# Patient Record
Sex: Female | Born: 1985 | Race: Black or African American | Hispanic: No | Marital: Single | Smoking: Never smoker
Health system: Southern US, Community
[De-identification: ages and names within clinical notes are randomized; demographics above are authoritative.]

---

## 2015-03-31 ENCOUNTER — Emergency Department
Admission: EM | Admit: 2015-03-31 | Discharge: 2015-03-31 | Disposition: A | Discharge status: Home / Self Care | Payer: Medicaid Other | Attending: Emergency Medicine | Admitting: Emergency Medicine

## 2015-03-31 ENCOUNTER — Encounter: Payer: Self-pay | Admitting: Emergency Medicine

## 2015-03-31 DIAGNOSIS — Z3202 Encounter for pregnancy test, result negative: Secondary | ICD-10-CM | POA: Diagnosis not present

## 2015-03-31 DIAGNOSIS — R1030 Lower abdominal pain, unspecified: Secondary | ICD-10-CM | POA: Diagnosis present

## 2015-03-31 DIAGNOSIS — N12 Tubulo-interstitial nephritis, not specified as acute or chronic: Secondary | ICD-10-CM | POA: Insufficient documentation

## 2015-03-31 LAB — URINALYSIS COMPLETE WITH MICROSCOPIC (ARMC ONLY)
Bilirubin Urine: NEGATIVE
Glucose, UA: NEGATIVE mg/dL
Ketones, ur: NEGATIVE mg/dL
NITRITE: NEGATIVE
PH: 6 (ref 5.0–8.0)
PROTEIN: 100 mg/dL — AB
SPECIFIC GRAVITY, URINE: 1.013 (ref 1.005–1.030)

## 2015-03-31 LAB — CBC WITH DIFFERENTIAL/PLATELET
BASOS PCT: 0 %
Basophils Absolute: 0.1 10*3/uL (ref 0–0.1)
EOS ABS: 0 10*3/uL (ref 0–0.7)
Eosinophils Relative: 0 %
HEMATOCRIT: 34.3 % — AB (ref 35.0–47.0)
Hemoglobin: 11.3 g/dL — ABNORMAL LOW (ref 12.0–16.0)
Lymphocytes Relative: 6 %
Lymphs Abs: 1 10*3/uL (ref 1.0–3.6)
MCH: 26.1 pg (ref 26.0–34.0)
MCHC: 32.9 g/dL (ref 32.0–36.0)
MCV: 79.5 fL — AB (ref 80.0–100.0)
Monocytes Absolute: 0.9 10*3/uL (ref 0.2–0.9)
Monocytes Relative: 6 %
NEUTROS ABS: 13.1 10*3/uL — AB (ref 1.4–6.5)
Neutrophils Relative %: 88 %
Platelets: 401 10*3/uL (ref 150–440)
RBC: 4.31 MIL/uL (ref 3.80–5.20)
RDW: 13.7 % (ref 11.5–14.5)
WBC: 15 10*3/uL — ABNORMAL HIGH (ref 3.6–11.0)

## 2015-03-31 LAB — COMPREHENSIVE METABOLIC PANEL
ALT: 12 U/L — ABNORMAL LOW (ref 14–54)
AST: 12 U/L — ABNORMAL LOW (ref 15–41)
Albumin: 3.8 g/dL (ref 3.5–5.0)
Alkaline Phosphatase: 74 U/L (ref 38–126)
Anion gap: 7 (ref 5–15)
BUN: 7 mg/dL (ref 6–20)
CO2: 24 mmol/L (ref 22–32)
Calcium: 9 mg/dL (ref 8.9–10.3)
Chloride: 106 mmol/L (ref 101–111)
Creatinine, Ser: 0.64 mg/dL (ref 0.44–1.00)
GFR calc Af Amer: >60 mL/min (ref >60)
GFR calc non Af Amer: >60 mL/min (ref >60)
GLUCOSE: 130 mg/dL — AB (ref 65–99)
POTASSIUM: 4.1 mmol/L (ref 3.5–5.1)
Sodium: 137 mmol/L (ref 135–145)
Total Bilirubin: 0.6 mg/dL (ref 0.3–1.2)
Total Protein: 8.1 g/dL (ref 6.5–8.1)

## 2015-03-31 LAB — POCT PREGNANCY, URINE: PREG TEST UR: NEGATIVE

## 2015-03-31 LAB — LIPASE, BLOOD: Lipase: 27 U/L (ref 22–51)

## 2015-03-31 MED ORDER — LEVOFLOXACIN 500 MG PO TABS: 500.0000 mg | ORAL_TABLET | Freq: Every day | ORAL | Status: AC

## 2015-03-31 MED ORDER — SODIUM CHLORIDE 0.9 % IV BOLUS (SEPSIS)
1000.0000 mL | Freq: Once | INTRAVENOUS | Status: AC
  Administered 2015-03-31: 1000 mL via INTRAVENOUS

## 2015-03-31 MED ORDER — MORPHINE SULFATE 4 MG/ML IJ SOLN
4.0000 mg | Freq: Once | INTRAMUSCULAR | Status: AC
  Administered 2015-03-31: 4 mg via INTRAVENOUS

## 2015-03-31 MED ORDER — DEXTROSE 5 % IV SOLN
INTRAVENOUS | Status: AC
  Filled 2015-03-31: qty 10

## 2015-03-31 MED ORDER — ONDANSETRON HCL 4 MG/2ML IJ SOLN
INTRAMUSCULAR | Status: AC
  Filled 2015-03-31: qty 2

## 2015-03-31 MED ORDER — TRAMADOL HCL 50 MG PO TABS: 50.0000 mg | ORAL_TABLET | Freq: Four times a day (QID) | ORAL | Status: AC | PRN

## 2015-03-31 MED ORDER — MORPHINE SULFATE 4 MG/ML IJ SOLN
INTRAMUSCULAR | Status: AC
  Filled 2015-03-31: qty 1

## 2015-03-31 MED ORDER — ONDANSETRON HCL 4 MG/2ML IJ SOLN
4.0000 mg | Freq: Once | INTRAMUSCULAR | Status: AC
  Administered 2015-03-31: 4 mg via INTRAVENOUS

## 2015-03-31 MED ORDER — ONDANSETRON HCL 4 MG PO TABS: 4.0000 mg | ORAL_TABLET | Freq: Every day | ORAL | Status: AC | PRN

## 2015-03-31 MED ORDER — DEXTROSE 5 % IV SOLN
1.0000 g | Freq: Once | INTRAVENOUS | Status: AC
  Administered 2015-03-31: 1 g via INTRAVENOUS

## 2015-03-31 NOTE — Discharge Instructions (Signed)
Pyelonephritis, Adult Pyelonephritis is a kidney infection. A kidney infection can happen quickly, or it can last for a long time. HOME CARE   Take your medicine (antibiotics) as told. Finish it even if you start to feel better.  Keep all doctor visits as told.  Drink enough fluids to keep your pee (urine) clear or pale yellow.  Only take medicine as told by your doctor. GET HELP RIGHT AWAY IF:   You have a fever or lasting symptoms for more than 2-3 days.  You have a fever and your symptoms suddenly get worse.  You cannot take your medicine or drink fluids as told.  You have chills and shaking.  You feel very weak or pass out (faint).  You do not feel better after 2 days. MAKE SURE YOU:  Understand these instructions.  Will watch your condition.  Will get help right away if you are not doing well or get worse. Document Released: 12/11/2004 Document Revised: 05/04/2012 Document Reviewed: 04/23/2011 [facility] Patient Information 2015 [place], [place]. This information is not intended to replace advice given to you by your health care provider. Make sure you discuss any questions you have with your health care provider.    Please take your antibiotics as prescribed. Please take nausea and pain medication as needed, as written. Please follow up with her primary care doctor in 2-3 days for recheck. Return to the emergency department if you're unable to keep her antibiotics down due to vomiting, or you develop a fever or worsening abdominal pain.

## 2015-03-31 NOTE — ED Notes (Signed)
Unsure if febrile, has implenon

## 2015-03-31 NOTE — ED Provider Notes (Signed)
[facility] Emergency Department Provider Note  Time seen: 9:44 AM  I have reviewed the triage vital signs and the nursing notes.   HISTORY  Chief Complaint Abdominal Pain    HPI Cynthia Rice is a 29 y.o. female with no past medical history who presents the emergency department with lower abdominal pain, nausea and vomiting. According to the patient for the past 3 days she has had lower abdominal pain, and for the past 24 hours has been nauseated with vomiting. Patient denies any fevers at home. Patient states a history of urinary tract infections in the past which this feels similar to. Denies any dysuria but does state cloudy urine with a foul smell 1 week. Patient denies any diarrhea. Describes the pain as dull and constant moderate in severity. No noted modifying factors.    History reviewed. No pertinent past medical history.  There are no active problems to display for this patient.   No past surgical history on file.  No current outpatient prescriptions on file.  Allergies Review of patient's allergies indicates no known allergies.  No family history on file.  Social History History  Substance Use Topics  . Smoking status: Not on file  . Smokeless tobacco: Not on file  . Alcohol Use: Not on file    Review of Systems Constitutional: Negative for fever. Cardiovascular: Negative for chest pain. Respiratory: Negative for shortness of breath. Gastrointestinal: Positive for lower abdominal pain, nausea and vomiting. Negative diarrhea. Genitourinary: Negative for dysuria. Positive for foul smell and cloudy urine. Musculoskeletal: Positive for right back pain. Skin: Negative for rash.  10-point ROS otherwise negative.  ____________________________________________   PHYSICAL EXAM:  VITAL SIGNS: ED Triage Vitals  Enc Vitals Group     BP 03/31/15 0716 111/58 mmHg     Pulse Rate 03/31/15 0716 72     Resp 03/31/15 0716 18     Temp  03/31/15 0716 97.9 F (36.6 C)     Temp Source 03/31/15 0716 Oral     SpO2 03/31/15 0716 99 %     Weight 03/31/15 0716 215 lb (97.523 kg)     Height 03/31/15 0716 5\' 6"  (1.676 m)     Head Cir --      Peak Flow --      Pain Score 03/31/15 0719 10     Pain Loc --      Pain Edu? --      Excl. in GC? --     Constitutional: Alert and oriented. Well appearing and in no distress. Eyes: Normal exam ENT   Mouth/Throat: Dry mucous membranes. Cardiovascular: Normal rate, regular rhythm. No murmurs, rubs, or gallops. Respiratory: Normal respiratory effort without tachypnea nor retractions. Breath sounds are clear  Gastrointestinal: Mild suprapubic tenderness to palpation. Moderate right CVA tenderness to palpation. No rebound/guarding. Musculoskeletal: Nontender with normal range of motion in all extremities. Neurologic:  Normal speech and language.  Skin:  Skin is warm, dry and intact.  Psychiatric: Mood and affect are normal. Speech and behavior are normal.   ____________________________________________   INITIAL IMPRESSION / ASSESSMENT AND PLAN / ED COURSE  Pertinent labs & imaging results that were available during my care of the patient were reviewed by me and considered in my medical decision making (see chart for details).  29 year old female with no past medical history presents with lower abdominal pain. Exam shows mild suprapubic tenderness and moderate right CVA tenderness to palpation. Blood cell count elevated at 15, urine consistent with urinary  tract infection. Patient likely with early pyelonephritis. We'll IV hydrate, start IV antibiotics, and treat pain and nausea. Anticipate discharge home with oral antibiotics.  ____________________________________________   FINAL CLINICAL IMPRESSION(S) / ED DIAGNOSES  Pyelonephritis   Minna AntisKevin Marionna Gonia, MD 03/31/15 1038

## 2015-08-15 DIAGNOSIS — G43009 Migraine without aura, not intractable, without status migrainosus: Secondary | ICD-10-CM

## 2015-08-15 HISTORY — DX: Migraine without aura, not intractable, without status migrainosus: G43.009

## 2015-12-19 ENCOUNTER — Encounter: Payer: Self-pay | Admitting: Urgent Care

## 2015-12-19 DIAGNOSIS — O3481 Maternal care for other abnormalities of pelvic organs, first trimester: Secondary | ICD-10-CM | POA: Diagnosis not present

## 2015-12-19 DIAGNOSIS — O210 Mild hyperemesis gravidarum: Secondary | ICD-10-CM | POA: Insufficient documentation

## 2015-12-19 DIAGNOSIS — N83202 Unspecified ovarian cyst, left side: Secondary | ICD-10-CM | POA: Diagnosis not present

## 2015-12-19 DIAGNOSIS — Z3A01 Less than 8 weeks gestation of pregnancy: Secondary | ICD-10-CM | POA: Diagnosis not present

## 2015-12-19 LAB — POCT PREGNANCY, URINE: Preg Test, Ur: POSITIVE — AB

## 2015-12-19 NOTE — ED Notes (Signed)
Patient presents with c/o vomiting today. Denies diarrhea, fever, and urinary symptoms. Patient advises RN of approximately 6 emesis episodes.

## 2015-12-19 NOTE — ED Notes (Signed)
Spoke with Inocencio Homes, MD - made her aware of presenting c/o and triage assessment. Patient with no abd pain, vaginal bleeding, back pain, or urinary c/o. MD made aware that POCT pregnancy test was positive. No further orders at this time - MD only wants pregnancy test.

## 2015-12-20 ENCOUNTER — Emergency Department
Admission: EM | Admit: 2015-12-20 | Discharge: 2015-12-20 | Disposition: A | Discharge status: Home / Self Care | Payer: Medicaid Other | Attending: Emergency Medicine | Admitting: Emergency Medicine

## 2015-12-20 ENCOUNTER — Emergency Department: Payer: Medicaid Other

## 2015-12-20 DIAGNOSIS — N83209 Unspecified ovarian cyst, unspecified side: Secondary | ICD-10-CM

## 2015-12-20 DIAGNOSIS — Z3491 Encounter for supervision of normal pregnancy, unspecified, first trimester: Secondary | ICD-10-CM

## 2015-12-20 DIAGNOSIS — O219 Vomiting of pregnancy, unspecified: Secondary | ICD-10-CM

## 2015-12-20 LAB — COMPREHENSIVE METABOLIC PANEL
ALBUMIN: 4.4 g/dL (ref 3.5–5.0)
ALT: 11 U/L — AB (ref 14–54)
AST: 14 U/L — ABNORMAL LOW (ref 15–41)
Alkaline Phosphatase: 58 U/L (ref 38–126)
Anion gap: 8 (ref 5–15)
BUN: 7 mg/dL (ref 6–20)
CHLORIDE: 105 mmol/L (ref 101–111)
CO2: 22 mmol/L (ref 22–32)
Calcium: 9.2 mg/dL (ref 8.9–10.3)
Creatinine, Ser: 0.56 mg/dL (ref 0.44–1.00)
GFR calc Af Amer: >60 mL/min (ref >60)
GFR calc non Af Amer: >60 mL/min (ref >60)
Glucose, Bld: 85 mg/dL (ref 65–99)
Potassium: 3.9 mmol/L (ref 3.5–5.1)
SODIUM: 135 mmol/L (ref 135–145)
Total Bilirubin: 0.7 mg/dL (ref 0.3–1.2)
Total Protein: 8 g/dL (ref 6.5–8.1)

## 2015-12-20 LAB — CBC
HCT: 38.7 % (ref 35.0–47.0)
Hemoglobin: 12.8 g/dL (ref 12.0–16.0)
MCH: 26.3 pg (ref 26.0–34.0)
MCHC: 33 g/dL (ref 32.0–36.0)
MCV: 79.7 fL — AB (ref 80.0–100.0)
Platelets: 357 10*3/uL (ref 150–440)
RBC: 4.86 MIL/uL (ref 3.80–5.20)
RDW: 14.6 % — ABNORMAL HIGH (ref 11.5–14.5)
WBC: 9.6 10*3/uL (ref 3.6–11.0)

## 2015-12-20 MED ORDER — PROMETHAZINE HCL 12.5 MG PO TABS: 12.5000 mg | ORAL_TABLET | Freq: Four times a day (QID) | ORAL | Status: DC | PRN

## 2015-12-20 NOTE — ED Notes (Signed)
Patient transported to Ultrasound 

## 2015-12-20 NOTE — ED Notes (Addendum)
MD at the bedside  

## 2015-12-20 NOTE — ED Provider Notes (Signed)
[facility] Emergency Department Provider Note  ____________________________________________  Time seen: 2:15 AM  I have reviewed the triage vital signs and the nursing notes.   HISTORY  Chief Complaint Emesis     HPI Cynthia Rice is a 30 y.o. female presents with nausea and vomiting times today accompanied by pelvic discomfort. Patient denies any fever no diarrhea or urinary symptoms. Patient states current discomfort is described as mild. Patient states last menstrual period was December as she was "changing between birth controls.      Past Medical history  On top. Pregnancy 3  There are no active problems to display for this patient.   Past surgical history   none  Current Outpatient Rx  Name  Route  Sig  Dispense  Refill  . ondansetron (ZOFRAN) 4 MG tablet   Oral   Take 1 tablet (4 mg total) by mouth daily as needed for nausea or vomiting.   15 tablet   1   . traMADol (ULTRAM) 50 MG tablet   Oral   Take 1 tablet (50 mg total) by mouth every 6 (six) hours as needed.   20 tablet   0     Allergies Review of patient's allergies indicates no known allergies.  No family history on file.  Social History Social History  Substance Use Topics  . Smoking status: Never Smoker   . Smokeless tobacco: None  . Alcohol Use: No    Review of Systems  Constitutional: Negative for fever. Eyes: Negative for visual changes. ENT: Negative for sore throat. Cardiovascular: Negative for chest pain. Respiratory: Negative for shortness of breath. Gastrointestinal: Negative for abdominal pain. Positive for vomiting  Genitourinary: Negative for dysuria. Musculoskeletal: Negative for back pain. Skin: Negative for rash. Neurological: Negative for headaches, focal weakness or numbness.   10-point ROS otherwise negative.  ____________________________________________   PHYSICAL EXAM:  VITAL SIGNS: ED Triage Vitals  Enc Vitals Group     BP  12/19/15 2146 113/69 mmHg     Pulse Rate 12/19/15 2146 81     Resp 12/19/15 2146 16     Temp 12/19/15 2146 97.6 F (36.4 C)     Temp Source 12/19/15 2146 Oral     SpO2 12/19/15 2146 100 %     Weight 12/19/15 2146 220 lb (99.791 kg)     Height 12/19/15 2146  (1.676 m)     Head Cir --      Peak Flow --      Pain Score 12/19/15 2146 0     Pain Loc --      Pain Edu? --      Excl. in GC? --      Constitutional: Alert and oriented. Well appearing and in no distress. Eyes: Conjunctivae are normal. PERRL. Normal extraocular movements. ENT   Head: Normocephalic and atraumatic.   Nose: No congestion/rhinnorhea.   Mouth/Throat: Mucous membranes are moist.   Neck: No stridor. Hematological/Lymphatic/Immunilogical: No cervical lymphadenopathy. Cardiovascular: Normal rate, regular rhythm. Normal and symmetric distal pulses are present in all extremities. No murmurs, rubs, or gallops. Respiratory: Normal respiratory effort without tachypnea nor retractions. Breath sounds are clear and equal bilaterally. No wheezes/rales/rhonchi. Gastrointestinal: Soft and nontender. No distention. There is no CVA tenderness. Genitourinary: deferred Musculoskeletal: Nontender with normal range of motion in all extremities. No joint effusions.  No lower extremity tenderness nor edema. Neurologic:  Normal speech and language. No gross focal neurologic deficits are appreciated. Speech is normal.  Skin:  Skin is  warm, dry and intact. No rash noted. Psychiatric: Mood and affect are normal. Speech and behavior are normal. Patient exhibits appropriate insight and judgment.  ____________________________________________    LABS (pertinent positives/negatives)  Labs Reviewed  CBC - Abnormal; Notable for the following:    MCV 79.7 (*)    RDW 14.6 (*)    All other components within normal limits  COMPREHENSIVE METABOLIC PANEL - Abnormal; Notable for the following:    AST 14 (*)    ALT 11 (*)     All other components within normal limits  POCT PREGNANCY, URINE - Abnormal; Notable for the following:    Preg Test, Ur POSITIVE (*)    All other components within normal limits       RADIOLOGY US OB Comp Less 14 Wks (Final result) Result time: 12/20/15 03:55:38   Final result by Rad Results In Interface (12/20/15 03:55:38)   Narrative:   CLINICAL DATA: Pelvic pain in first trimester pregnancy.  EXAM: OBSTETRIC <14 WK [place] AND TRANSVAGINAL OB US  TECHNIQUE: Both transabdominal and transvaginal ultrasound examinations were performed for complete evaluation of the gestation as well as the maternal uterus, adnexal regions, and pelvic cul-de-sac. Transvaginal technique was performed to assess early pregnancy.  COMPARISON: None.  FINDINGS: Intrauterine gestational sac: Visualized/normal in shape.  Yolk sac: Present  Embryo: Present  Cardiac Activity: Present  Heart Rate: 137 bpm  CRL: 8.8 mm  6 w  6 d         [place] EDC: 08/08/2016  Subchorionic hemorrhage: None visualized.  Maternal uterus/adnexae: 3 cm cyst in the left ovary with minimal mural nodularity that is nonvascular.  IMPRESSION: 1. Single living intrauterine pregnancy measuring 6 weeks 6 days. 2. 3 cm cyst left ovary. Minimal mural nodularity may reflect internal hemorrhage/debris but surveillance is recommended to ensure normalization.   Electronically Signed By: Marnee Spring M.D. On: 12/20/2015 03:55          US OB Transvaginal (Final result) Result time: 12/20/15 03:55:38   Procedure changed from US OB Comp Less 14 Wks      Final result by Rad Results In Interface (12/20/15 03:55:38)   Narrative:   CLINICAL DATA: Pelvic pain in first trimester pregnancy.  EXAM: OBSTETRIC <14 WK [place] AND TRANSVAGINAL OB US  TECHNIQUE: Both transabdominal and transvaginal ultrasound examinations were performed for complete evaluation of the gestation as well as the maternal  uterus, adnexal regions, and pelvic cul-de-sac. Transvaginal technique was performed to assess early pregnancy.  COMPARISON: None.  FINDINGS: Intrauterine gestational sac: Visualized/normal in shape.  Yolk sac: Present  Embryo: Present  Cardiac Activity: Present  Heart Rate: 137 bpm  CRL: 8.8 mm  6 w  6 d         [place] EDC: 08/08/2016  Subchorionic hemorrhage: None visualized.  Maternal uterus/adnexae: 3 cm cyst in the left ovary with minimal mural nodularity that is nonvascular.  IMPRESSION: 1. Single living intrauterine pregnancy measuring 6 weeks 6 days. 2. 3 cm cyst left ovary. Minimal mural nodularity may reflect internal hemorrhage/debris but surveillance is recommended to ensure normalization.   Electronically Signed By: Marnee Spring M.D. On: 12/20/2015 03:55       INITIAL IMPRESSION / ASSESSMENT AND PLAN / ED COURSE  Pertinent labs & imaging results that were available during my care of the patient were reviewed by me and considered in my medical decision making (see chart for details).   ____________________________________________   FINAL CLINICAL IMPRESSION(S) / ED DIAGNOSES  Final diagnoses:  First  trimester pregnancy  Cyst of ovary, unspecified laterality      Darci Current, MD 12/20/15 [phone]

## 2015-12-20 NOTE — Discharge Instructions (Signed)
First Trimester of Pregnancy The first trimester of pregnancy is from week 1 until the end of week 12 (months 1 through 3). A week after a sperm fertilizes an egg, the egg will implant on the wall of the uterus. This embryo will begin to develop into a baby. Genes from you and your partner are forming the baby. The female genes determine whether the baby is a boy or a girl. At 6-8 weeks, the eyes and face are formed, and the heartbeat can be seen on ultrasound. At the end of 12 weeks, all the baby's organs are formed.  Now that you are pregnant, you will want to do everything you can to have a healthy baby. Two of the most important things are to get good prenatal care and to follow your health care provider's instructions. Prenatal care is all the medical care you receive before the baby's birth. This care will help prevent, find, and treat any problems during the pregnancy and childbirth. BODY CHANGES Your body goes through many changes during pregnancy. The changes vary from woman to woman.   You may gain or lose a couple of pounds at first.  You may feel sick to your stomach (nauseous) and throw up (vomit). If the vomiting is uncontrollable, call your health care provider.  You may tire easily.  You may develop headaches that can be relieved by medicines approved by your health care provider.  You may urinate more often. Painful urination may mean you have a bladder infection.  You may develop heartburn as a result of your pregnancy.  You may develop constipation because certain hormones are causing the muscles that push waste through your intestines to slow down.  You may develop hemorrhoids or swollen, bulging veins (varicose veins).  Your breasts may begin to grow larger and become tender. Your nipples may stick out more, and the tissue that surrounds them (areola) may become darker.  Your gums may bleed and may be sensitive to brushing and flossing.  Dark spots or blotches (chloasma,  mask of pregnancy) may develop on your face. This will likely fade after the baby is born.  Your menstrual periods will stop.  You may have a loss of appetite.  You may develop cravings for certain kinds of food.  You may have changes in your emotions from day to day, such as being excited to be pregnant or being concerned that something may go wrong with the pregnancy and baby.  You may have more vivid and strange dreams.  You may have changes in your hair. These can include thickening of your hair, rapid growth, and changes in texture. Some women also have hair loss during or after pregnancy, or hair that feels dry or thin. Your hair will most likely return to normal after your baby is born. WHAT TO EXPECT AT YOUR PRENATAL VISITS During a routine prenatal visit:  You will be weighed to make sure you and the baby are growing normally.  Your blood pressure will be taken.  Your abdomen will be measured to track your baby's growth.  The fetal heartbeat will be listened to starting around week 10 or 12 of your pregnancy.  Test results from any previous visits will be discussed. Your health care provider may ask you:  How you are feeling.  If you are feeling the baby move.  If you have had any abnormal symptoms, such as leaking fluid, bleeding, severe headaches, or abdominal cramping.  If you are using any tobacco products,   including cigarettes, chewing tobacco, and electronic cigarettes.  If you have any questions. Other tests that may be performed during your first trimester include:  Blood tests to find your blood type and to check for the presence of any previous infections. They will also be used to check for low iron levels (anemia) and Rh antibodies. Later in the pregnancy, blood tests for diabetes will be done along with other tests if problems develop.  Urine tests to check for infections, diabetes, or protein in the urine.  An ultrasound to confirm the proper growth  and development of the baby.  An amniocentesis to check for possible genetic problems.  Fetal screens for spina bifida and Down syndrome.  You may need other tests to make sure you and the baby are doing well.  HIV (human immunodeficiency virus) testing. Routine prenatal testing includes screening for HIV, unless you choose not to have this test. HOME CARE INSTRUCTIONS  Medicines  Follow your health care provider's instructions regarding medicine use. Specific medicines may be either safe or unsafe to take during pregnancy.  Take your prenatal vitamins as directed.  If you develop constipation, try taking a stool softener if your health care provider approves. Diet  Eat regular, well-balanced meals. Choose a variety of foods, such as meat or vegetable-based protein, fish, milk and low-fat dairy products, vegetables, fruits, and whole grain breads and cereals. Your health care provider will help you determine the amount of weight gain that is right for you.  Avoid raw meat and uncooked cheese. These carry germs that can cause birth defects in the baby.  Eating four or five small meals rather than three large meals a day may help relieve nausea and vomiting. If you start to feel nauseous, eating a few soda crackers can be helpful. Drinking liquids between meals instead of during meals also seems to help nausea and vomiting.  If you develop constipation, eat more high-fiber foods, such as fresh vegetables or fruit and whole grains. Drink enough fluids to keep your urine clear or pale yellow. Activity and Exercise  Exercise only as directed by your health care provider. Exercising will help you:  Control your weight.  Stay in shape.  Be prepared for labor and delivery.  Experiencing pain or cramping in the lower abdomen or low back is a good sign that you should stop exercising. Check with your health care provider before continuing normal exercises.  Try to avoid standing for long  periods of time. Move your legs often if you must stand in one place for a long time.  Avoid heavy lifting.  Wear low-heeled shoes, and practice good posture.  You may continue to have sex unless your health care provider directs you otherwise. Relief of Pain or Discomfort  Wear a good support bra for breast tenderness.   Take warm sitz baths to soothe any pain or discomfort caused by hemorrhoids. Use hemorrhoid cream if your health care provider approves.   Rest with your legs elevated if you have leg cramps or low back pain.  If you develop varicose veins in your legs, wear support hose. Elevate your feet for 15 minutes, 3-4 times a day. Limit salt in your diet. Prenatal Care  Schedule your prenatal visits by the twelfth week of pregnancy. They are usually scheduled monthly at first, then more often in the last 2 months before delivery.  Write down your questions. Take them to your prenatal visits.  Keep all your prenatal visits as directed by your   health care provider. Safety  Wear your seat belt at all times when driving.  Make a list of emergency phone numbers, including numbers for family, friends, the hospital, and police and fire departments. General Tips  Ask your health care provider for a referral to a local prenatal education class. Begin classes no later than at the beginning of month 6 of your pregnancy.  Ask for help if you have counseling or nutritional needs during pregnancy. Your health care provider can offer advice or refer you to specialists for help with various needs.  Do not use hot tubs, steam rooms, or saunas.  Do not douche or use tampons or scented sanitary pads.  Do not cross your legs for long periods of time.  Avoid cat litter boxes and soil used by cats. These carry germs that can cause birth defects in the baby and possibly loss of the fetus by miscarriage or stillbirth.  Avoid all smoking, herbs, alcohol, and medicines not prescribed by  your health care provider. Chemicals in these affect the formation and growth of the baby.  Do not use any tobacco products, including cigarettes, chewing tobacco, and electronic cigarettes. If you need help quitting, ask your health care provider. You may receive counseling support and other resources to help you quit.  Schedule a dentist appointment. At home, brush your teeth with a soft toothbrush and be gentle when you floss. SEEK MEDICAL CARE IF:   You have dizziness.  You have mild pelvic cramps, pelvic pressure, or nagging pain in the abdominal area.  You have persistent nausea, vomiting, or diarrhea.  You have a bad smelling vaginal discharge.  You have pain with urination.  You notice increased swelling in your face, hands, legs, or ankles. SEEK IMMEDIATE MEDICAL CARE IF:   You have a fever.  You are leaking fluid from your vagina.  You have spotting or bleeding from your vagina.  You have severe abdominal cramping or pain.  You have rapid weight gain or loss.  You vomit blood or material that looks like coffee grounds.  You are exposed to German measles and have never had them.  You are exposed to fifth disease or chickenpox.  You develop a severe headache.  You have shortness of breath.  You have any kind of trauma, such as from a fall or a car accident.   This information is not intended to replace advice given to you by your health care provider. Make sure you discuss any questions you have with your health care provider.   Document Released: 10/28/2001 Document Revised: 11/24/2014 Document Reviewed: 09/13/2013 Elsevier Interactive Patient Education 2016 [employer]  

## 2016-10-12 IMAGING — US US OB TRANSVAGINAL
1 series · 14 of 28 positions shown · non-contrast
Comparison: None.

CLINICAL DATA: Pelvic pain in first trimester pregnancy.

EXAM:
OBSTETRIC <14 WK US AND TRANSVAGINAL OB US
TECHNIQUE: Both transabdominal and transvaginal ultrasound examinations were
performed for complete evaluation of the gestation as well as the
maternal uterus, adnexal regions, and pelvic cul-de-sac.
Transvaginal technique was performed to assess early pregnancy.

[Series 1: us ob transvaginal · 0.28mm/px · 60 acquisitions, 14 frames shown]
[im 3/60]
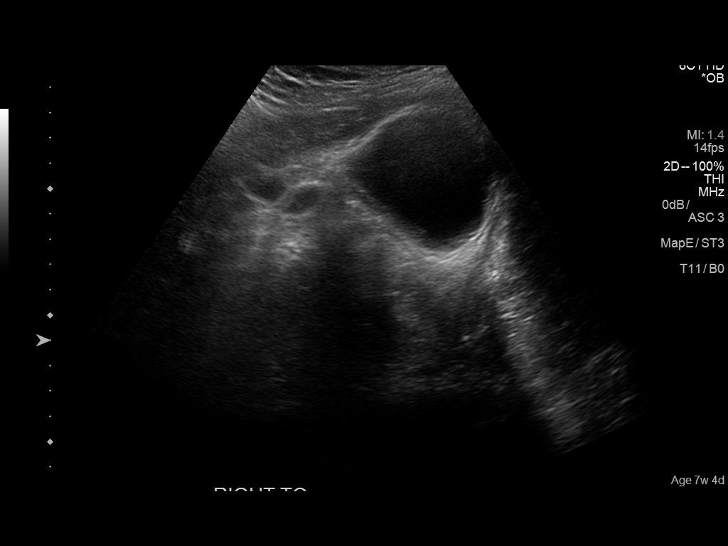
[im 7/60]
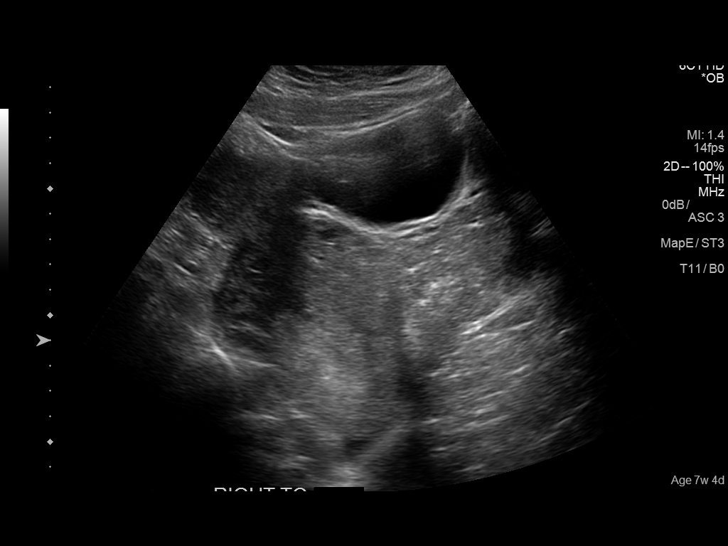
[im 11/60]
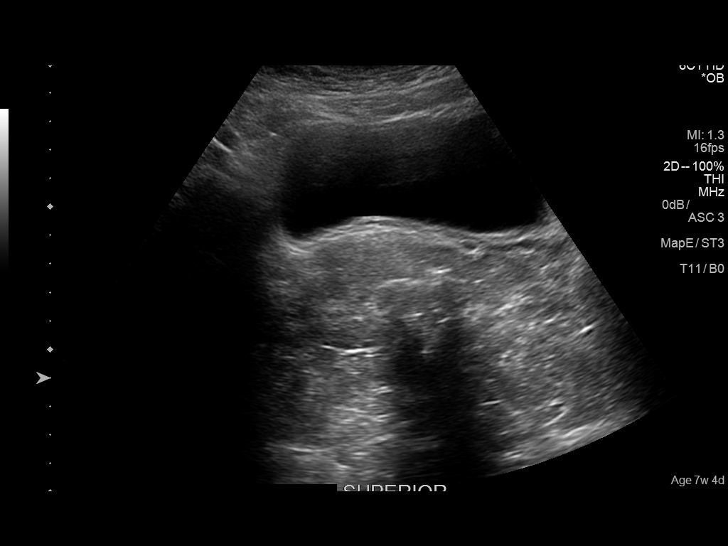
[im 16/60]
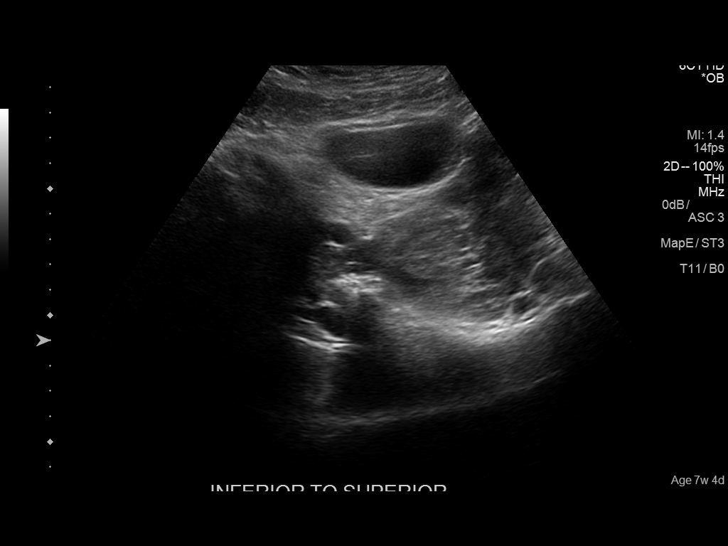
[im 20/60]
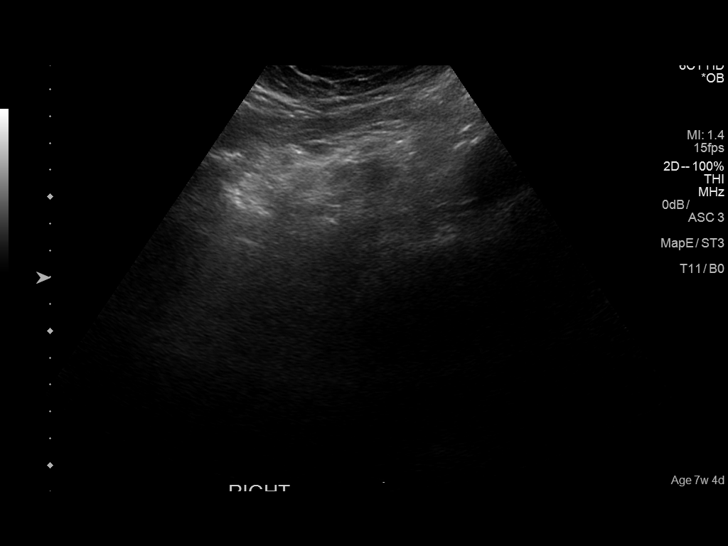
[im 25/60]
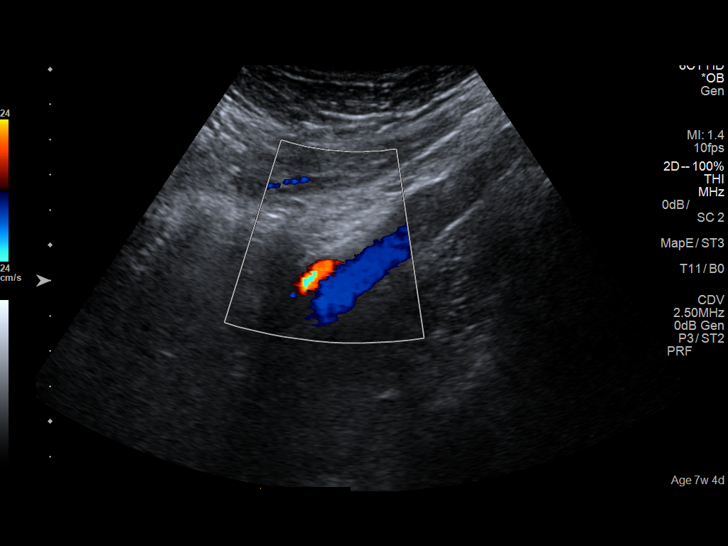
[im 29/60]
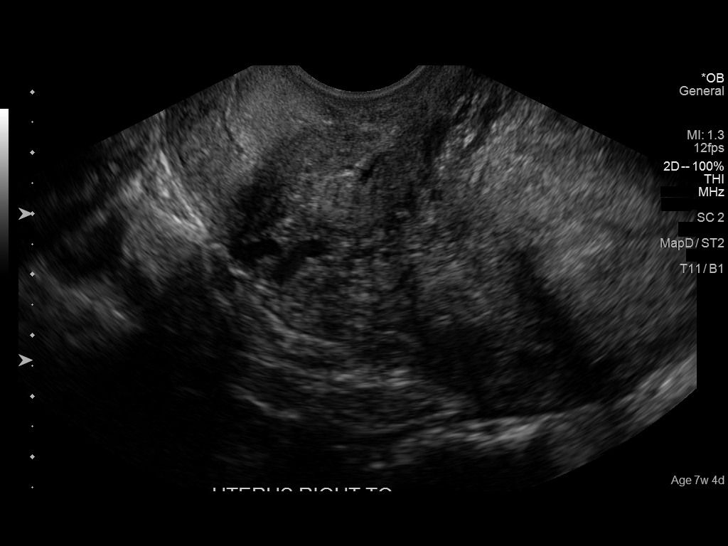
[im 33/60]
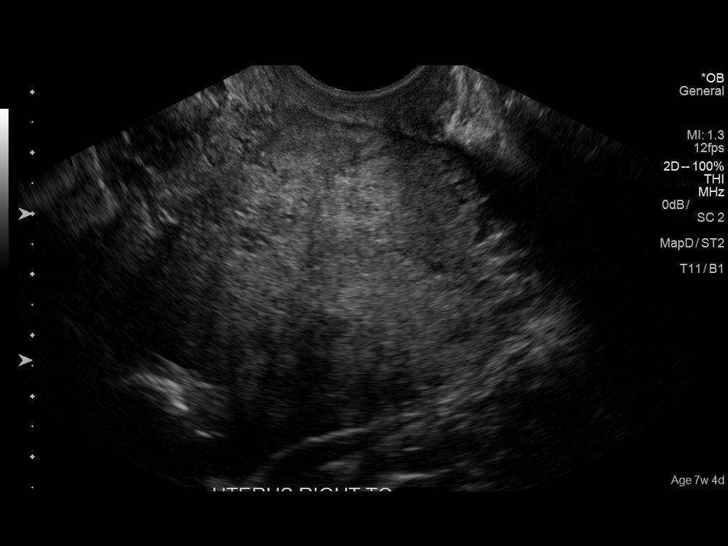
[im 38/60]
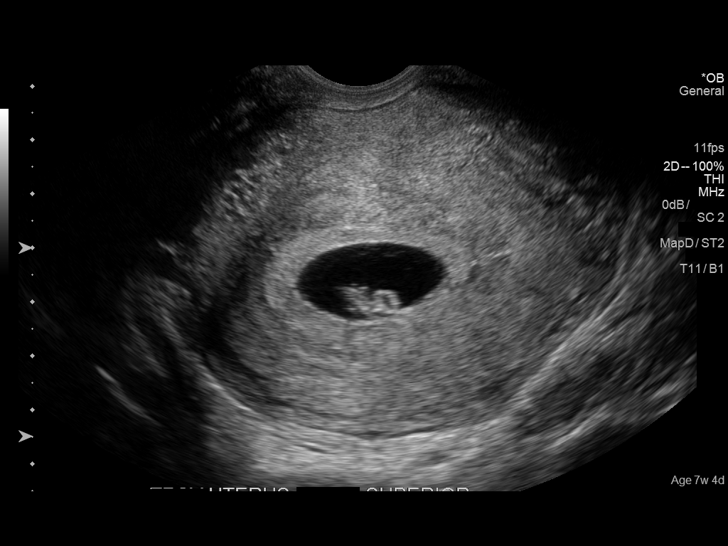
[im 42/60]
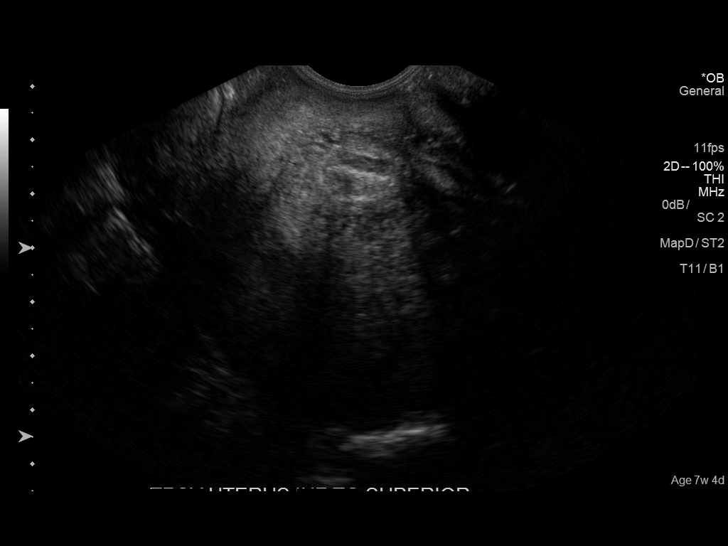
[im 46/60]
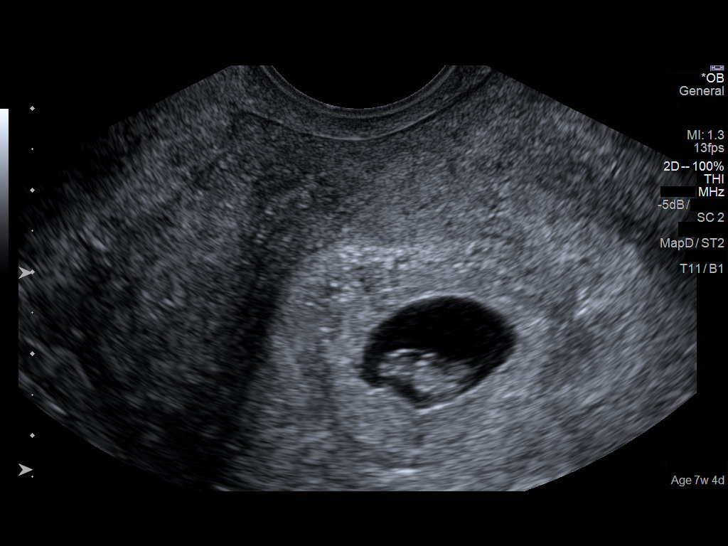
[im 51/60]
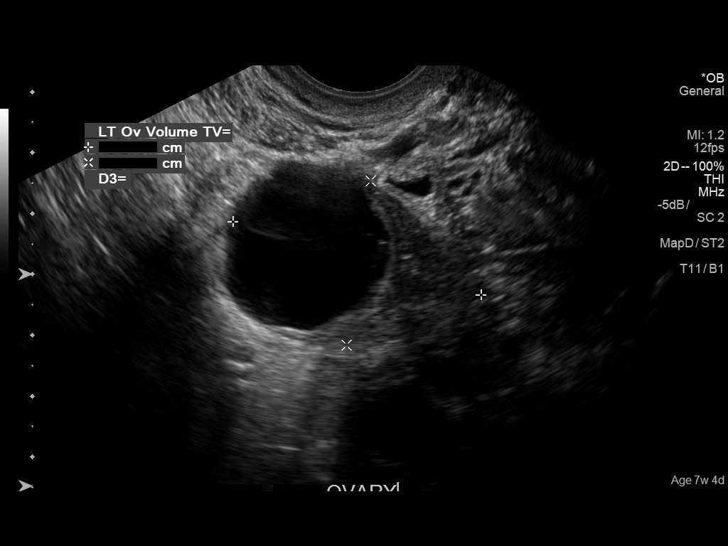
[im 55/60]
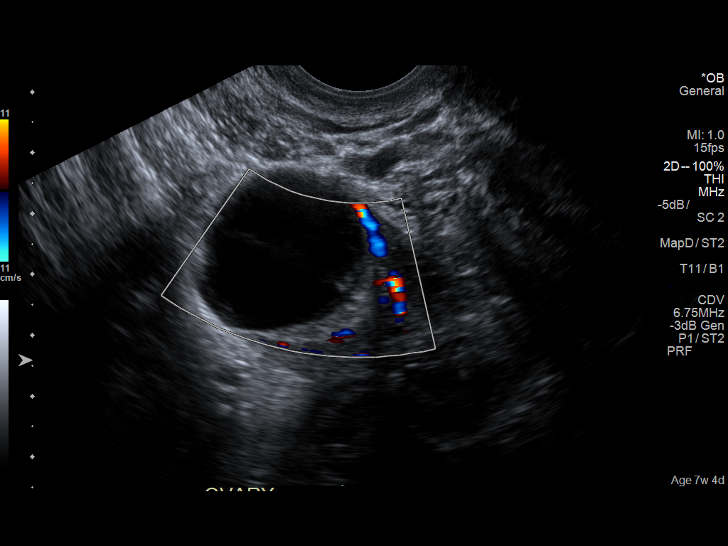
[im 60/60]
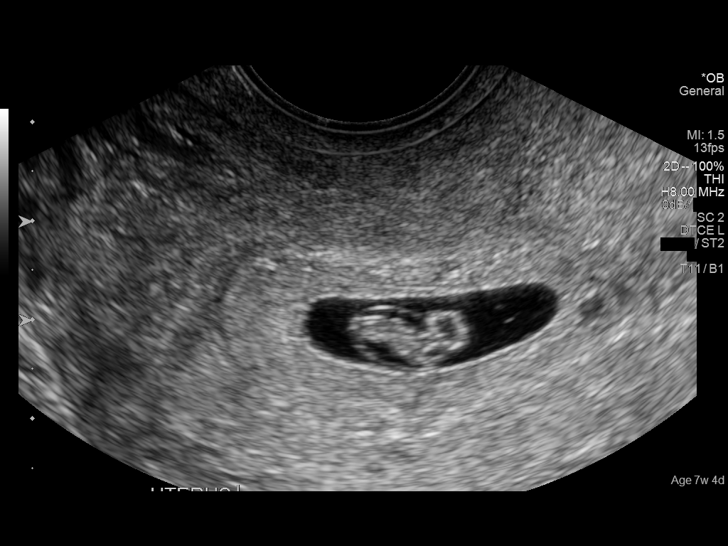

[14 of 28 positions shown; findings below may reference images not displayed]

FINDINGS: Intrauterine gestational sac: Visualized/normal in shape.

Yolk sac:  Present

Embryo:  Present

Cardiac Activity: Present

Heart Rate: 137  bpm

CRL:  8.8 mm   6 w   6 d                  US EDC: 08/08/2016

Subchorionic hemorrhage:  None visualized.

Maternal uterus/adnexae: 3 cm cyst in the left ovary with minimal
mural nodularity that is nonvascular.
IMPRESSION: 1. Single living intrauterine pregnancy measuring 6 weeks 6 days.
2. 3 cm cyst left ovary. Minimal mural nodularity may reflect
internal hemorrhage/debris but surveillance is recommended to ensure
normalization.

## 2017-06-18 DIAGNOSIS — O99340 Other mental disorders complicating pregnancy, unspecified trimester: Secondary | ICD-10-CM | POA: Insufficient documentation

## 2017-06-18 HISTORY — DX: Other mental disorders complicating pregnancy, unspecified trimester: O99.340

## 2017-08-17 DIAGNOSIS — O99013 Anemia complicating pregnancy, third trimester: Secondary | ICD-10-CM

## 2017-08-17 HISTORY — DX: Anemia complicating pregnancy, third trimester: O99.013

## 2017-09-12 DIAGNOSIS — D573 Sickle-cell trait: Secondary | ICD-10-CM | POA: Insufficient documentation

## 2017-09-12 HISTORY — DX: Sickle-cell trait: D57.3

## 2024-02-26 ENCOUNTER — Ambulatory Visit: Admitting: Pediatrics

## 2024-02-26 ENCOUNTER — Encounter: Payer: Self-pay | Admitting: Pediatrics

## 2024-02-26 VITALS — BP 119/85 | HR 72 | Temp 97.8°F | Ht 66.0 in | Wt 234.6 lb

## 2024-02-26 DIAGNOSIS — Z13220 Encounter for screening for lipoid disorders: Secondary | ICD-10-CM | POA: Diagnosis not present

## 2024-02-26 DIAGNOSIS — Z131 Encounter for screening for diabetes mellitus: Secondary | ICD-10-CM

## 2024-02-26 DIAGNOSIS — Z862 Personal history of diseases of the blood and blood-forming organs and certain disorders involving the immune mechanism: Secondary | ICD-10-CM

## 2024-02-26 DIAGNOSIS — Z7689 Persons encountering health services in other specified circumstances: Secondary | ICD-10-CM | POA: Diagnosis not present

## 2024-02-26 DIAGNOSIS — Z6837 Body mass index (BMI) 37.0-37.9, adult: Secondary | ICD-10-CM

## 2024-02-26 DIAGNOSIS — Z3009 Encounter for other general counseling and advice on contraception: Secondary | ICD-10-CM | POA: Diagnosis not present

## 2024-02-26 DIAGNOSIS — Z1330 Encounter for screening examination for mental health and behavioral disorders, unspecified: Secondary | ICD-10-CM | POA: Diagnosis not present

## 2024-02-26 DIAGNOSIS — R6889 Other general symptoms and signs: Secondary | ICD-10-CM

## 2024-02-26 NOTE — Patient Instructions (Addendum)
 The website we discussed today: [email or web address] - to help find a therapist, be sure to filter through zip code, insurance, and you can also check off any area they work in. You should be able to contact them directly from the website through phone or email. Let me know if having trouble finding someone and will help where I can!  Good to meet you! Welcome to [facility]!  As your primary care doctor, I look forward to working with you to help you reach your health goals.  Please be aware of a couple of logistical items: - If you message me on mychart, it may take me 1-2 business days to get back to you. This is for non-urgent messaging.  - If you require urgent clinical attention, please call the clinic or present to urgent care/emergency room - If you have labs, I typically will send a message about them in 1-2 business days. - I am not here on Mondays, otherwise will be available from Tuesday-Friday during 8a-5pm.

## 2024-02-26 NOTE — Progress Notes (Signed)
 Establish Care Note  BP 119/85   Pulse 72   Temp 97.8 F (36.6 C) (Oral)   Ht 5\' 6"  (1.676 m)   Wt 234 lb 9.6 oz (106.4 kg)   LMP 02/05/2024 (Approximate)   SpO2 97%   BMI 37.87 kg/m    Subjective:    Patient ID: Cynthia Rice, female    DOB: [birth year], 38 y.o.   MRN: [id number]  HPI: Cynthia Rice is a 38 y.o. female  Chief Complaint  Patient presents with   Establish Care    Establishing care, the following was discussed today:  Discussed the use of AI scribe software for clinical note transcription with the patient, who gave verbal consent to proceed.  History of Present Illness   Cynthia Rice is a 38 year old female who presents for a routine check-up and discussion of birth control options.  She has not seen a doctor in approximately six years and is here for a routine check-up and to discuss birth control options. She currently uses a Nexplanon implant, placed in 2021, and is considering other contraceptive methods. Her periods now last about seven days, and she experiences periodic dyspareunia.  She has a history of using Nexplanon, with her first experience resulting in a year-long period followed by amenorrhea, which she preferred. Currently, her periods last about seven days. She experiences periodic dyspareunia, which she associates with her current contraceptive method.  She has a family history of diabetes and has experienced symptoms suggestive of iron deficiency, such as pica (ice craving) and feeling extremely cold, especially during winter. She has had extremely low iron levels in the past and does not take iron pills due to constipation.  She has experienced significant life stressors, including the illness and passing of her mother in 2018 and relationship challenges. She reports a history of insomnia during her fourth pregnancy, for which she was prescribed Ambien. She acknowledges having days where she feels down but generally manages to 'pick herself up'. She  has not engaged in therapy but expresses interest in exploring this option.     Current Outpatient Medications on File Prior to Visit  Medication Sig Dispense Refill   promethazine (PHENERGAN) 12.5 MG tablet Take 1 tablet (12.5 mg total) by mouth every 6 (six) hours as needed for nausea or vomiting. (Patient not taking: Reported on 02/26/2024) 30 tablet 0   No current facility-administered medications on file prior to visit.    #HM Will review HM records and updated as needed.  Relevant past medical, surgical, family and social history reviewed and updated as indicated. Interim medical history since our last visit reviewed. Allergies and medications reviewed and updated.  ROS per HPI unless specifically indicated above     Objective:    BP 119/85   Pulse 72   Temp 97.8 F (36.6 C) (Oral)   Ht 5\' 6"  (1.676 m)   Wt 234 lb 9.6 oz (106.4 kg)   LMP 02/05/2024 (Approximate)   SpO2 97%   BMI 37.87 kg/m   Wt Readings from Last 3 Encounters:  02/26/24 234 lb 9.6 oz (106.4 kg)  12/19/15 220 lb (99.8 kg)  03/31/15 215 lb (97.5 kg)     Physical Exam Constitutional:      Appearance: Normal appearance.  HENT:     Head: Normocephalic and atraumatic.  Eyes:     Pupils: Pupils are equal, round, and reactive to light.  Cardiovascular:     Rate and Rhythm: Normal rate and regular rhythm.  Pulses: Normal pulses.     Heart sounds: Normal heart sounds.  Pulmonary:     Effort: Pulmonary effort is normal.     Breath sounds: Normal breath sounds.  Abdominal:     General: Abdomen is flat.     Palpations: Abdomen is soft.  Musculoskeletal:        General: Normal range of motion.     Cervical back: Normal range of motion.  Skin:    General: Skin is warm and dry.     Capillary Refill: Capillary refill takes less than 2 seconds.  Neurological:     General: No focal deficit present.     Mental Status: She is alert. Mental status is at baseline.  Psychiatric:        Mood and Affect:  Mood normal.        Behavior: Behavior normal.         02/26/2024    1:52 PM  Depression screen PHQ 2/9  Decreased Interest 0  Down, Depressed, Hopeless 1  PHQ - 2 Score 1  Altered sleeping 0  Tired, decreased energy 2  Change in appetite 0  Feeling bad or failure about yourself  0  Trouble concentrating 0  Moving slowly or fidgety/restless 0  Suicidal thoughts 0  PHQ-9 Score 3  Difficult doing work/chores Not difficult at all        02/26/2024    1:52 PM  GAD 7 : Generalized Anxiety Score  Nervous, Anxious, on Edge 0  Control/stop worrying 0  Worry too much - different things 0  Trouble relaxing 0  Restless 0  Easily annoyed or irritable 1  Afraid - awful might happen 0  Total GAD 7 Score 1  Anxiety Difficulty Not difficult at all       Assessment & Plan:  Assessment & Plan   History of iron deficiency anemia Cold intolerance Symptoms suggestive of iron deficiency anemia with intolerance to oral iron. Discussed iron infusions as an alternative. - Order iron studies. - Consider iron infusions if levels are low. -     CBC with Differential/Platelet -     Iron, TIBC and Ferritin Panel -     Thyroid Panel With TSH  BMI 37.0-37.9, adult -     Comprehensive metabolic panel with GFR  Encounter for counseling regarding contraception Considering switching from Nexplanon due to prolonged periods and dyspareunia. Discussed IUD and tubal ligation as alternatives, including procedural details and effectiveness. - Check Nexplanon card for insertion date. - Discuss IUD and tubal ligation options. - Refer to gynecology for tubal ligation if desired.  Encounter to establish care Reviewed available patient record including history, medications, problem list. HM updated as able. Will review and/or request outside records (if applicable) and will fill remaining HM gaps as needed at follow up visit.  Encounter for behavioral health screening As part of their intake  evaluation, the patient was screened for depression, anxiety.  PHQ9 SCORE 3, GAD7 SCORE 1. Screening results negative for tested conditions. See plan under problem/diagnosis above. Experiences depression and anxiety. Discussed therapy and potential medication management. - Provide information on Psychology Today website. - Encourage contacting therapists. - Discuss potential for medication management if therapy is insufficient.   Diabetes mellitus screening -     Hemoglobin A1c  Lipid screening -     Lipid panel   Follow up plan: Return in about 2 weeks (around 03/11/2024) for Physical w pap.  Hadassah Letters, MD

## 2024-02-27 LAB — LIPID PANEL
Chol/HDL Ratio: 3.8 ratio (ref 0.0–4.4)
Cholesterol, Total: 195 mg/dL (ref 100–199)
HDL: 51 mg/dL (ref >39)
LDL Chol Calc (NIH): 127 mg/dL — ABNORMAL HIGH (ref 0–99)
Triglycerides: 96 mg/dL (ref 0–149)
VLDL Cholesterol Cal: 17 mg/dL (ref 5–40)

## 2024-02-27 LAB — IRON,TIBC AND FERRITIN PANEL
Ferritin: 8 ng/mL — ABNORMAL LOW (ref 15–150)
Iron Saturation: 12 % — ABNORMAL LOW (ref 15–55)
Iron: 59 ug/dL (ref 27–159)
Total Iron Binding Capacity: 476 ug/dL — ABNORMAL HIGH (ref 250–450)
UIBC: 417 ug/dL (ref 131–425)

## 2024-02-27 LAB — CBC WITH DIFFERENTIAL/PLATELET
Basophils Absolute: 0 10*3/uL (ref 0.0–0.2)
Basos: 1 %
EOS (ABSOLUTE): 0.3 10*3/uL (ref 0.0–0.4)
Eos: 5 %
Hematocrit: 38.5 % (ref 34.0–46.6)
Hemoglobin: 12.4 g/dL (ref 11.1–15.9)
Immature Grans (Abs): 0 10*3/uL (ref 0.0–0.1)
Immature Granulocytes: 0 %
Lymphocytes Absolute: 2.1 10*3/uL (ref 0.7–3.1)
Lymphs: 33 %
MCH: 25.5 pg — ABNORMAL LOW (ref 26.6–33.0)
MCHC: 32.2 g/dL (ref 31.5–35.7)
MCV: 79 fL (ref 79–97)
Monocytes Absolute: 0.4 10*3/uL (ref 0.1–0.9)
Monocytes: 6 %
Neutrophils Absolute: 3.5 10*3/uL (ref 1.4–7.0)
Neutrophils: 55 %
Platelets: 406 10*3/uL (ref 150–450)
RBC: 4.87 x10E6/uL (ref 3.77–5.28)
RDW: 16.5 % — ABNORMAL HIGH (ref 11.7–15.4)
WBC: 6.4 10*3/uL (ref 3.4–10.8)

## 2024-02-27 LAB — COMPREHENSIVE METABOLIC PANEL WITH GFR
ALT: 15 IU/L (ref 0–32)
AST: 17 IU/L (ref 0–40)
Albumin: 4.7 g/dL (ref 3.9–4.9)
Alkaline Phosphatase: 90 IU/L (ref 44–121)
BUN/Creatinine Ratio: 8 — ABNORMAL LOW (ref 9–23)
BUN: 6 mg/dL (ref 6–20)
Bilirubin Total: 0.4 mg/dL (ref 0.0–1.2)
CO2: 18 mmol/L — ABNORMAL LOW (ref 20–29)
Calcium: 9.6 mg/dL (ref 8.7–10.2)
Chloride: 106 mmol/L (ref 96–106)
Creatinine, Ser: 0.74 mg/dL (ref 0.57–1.00)
Globulin, Total: 2.7 g/dL (ref 1.5–4.5)
Glucose: 76 mg/dL (ref 70–99)
Potassium: 4.8 mmol/L (ref 3.5–5.2)
Sodium: 140 mmol/L (ref 134–144)
Total Protein: 7.4 g/dL (ref 6.0–8.5)
eGFR: 107 mL/min/{1.73_m2} (ref >59)

## 2024-02-27 LAB — THYROID PANEL WITH TSH
Free Thyroxine Index: 1.8 (ref 1.2–4.9)
T3 Uptake Ratio: 24 % (ref 24–39)
T4, Total: 7.4 ug/dL (ref 4.5–12.0)
TSH: 1.2 u[IU]/mL (ref 0.450–4.500)

## 2024-02-27 LAB — HEMOGLOBIN A1C
Est. average glucose Bld gHb Est-mCnc: 108 mg/dL
Hgb A1c MFr Bld: 5.4 % (ref 4.8–5.6)

## 2024-03-01 ENCOUNTER — Encounter: Payer: Self-pay | Admitting: Pediatrics

## 2024-03-01 DIAGNOSIS — E611 Iron deficiency: Secondary | ICD-10-CM | POA: Insufficient documentation

## 2024-03-03 ENCOUNTER — Encounter: Payer: Self-pay | Admitting: Pediatrics

## 2024-03-08 ENCOUNTER — Emergency Department
Admission: EM | Admit: 2024-03-08 | Discharge: 2024-03-08 | Disposition: A | Discharge status: Home / Self Care | Attending: Emergency Medicine | Admitting: Emergency Medicine

## 2024-03-08 ENCOUNTER — Other Ambulatory Visit: Payer: Self-pay

## 2024-03-08 DIAGNOSIS — M79644 Pain in right finger(s): Secondary | ICD-10-CM | POA: Diagnosis present

## 2024-03-08 DIAGNOSIS — L03011 Cellulitis of right finger: Secondary | ICD-10-CM | POA: Diagnosis not present

## 2024-03-08 MED ORDER — CEPHALEXIN 500 MG PO CAPS: 500.0000 mg | ORAL_CAPSULE | Freq: Four times a day (QID) | ORAL | 0 refills | Status: AC

## 2024-03-08 MED ORDER — CEPHALEXIN 500 MG PO CAPS
500.0000 mg | ORAL_CAPSULE | Freq: Once | ORAL | Status: AC
  Administered 2024-03-08: 500 mg via ORAL
  Filled 2024-03-08: qty 1

## 2024-03-08 MED ORDER — BACITRACIN ZINC 500 UNIT/GM EX OINT: TOPICAL_OINTMENT | Freq: Every day | CUTANEOUS | Status: DC

## 2024-03-08 MED ORDER — BACITRACIN ZINC 500 UNIT/GM EX OINT
TOPICAL_OINTMENT | Freq: Every day | CUTANEOUS | Status: DC
  Administered 2024-03-08: 1 via TOPICAL
  Filled 2024-03-08: qty 2.7

## 2024-03-08 MED ORDER — IBUPROFEN 800 MG PO TABS
800.0000 mg | ORAL_TABLET | Freq: Once | ORAL | Status: AC
  Administered 2024-03-08: 800 mg via ORAL
  Filled 2024-03-08: qty 1

## 2024-03-08 MED ORDER — LIDOCAINE HCL (PF) 1 % IJ SOLN
5.0000 mL | Freq: Once | INTRAMUSCULAR | Status: AC
  Administered 2024-03-08: 5 mL via INTRADERMAL
  Filled 2024-03-08: qty 5

## 2024-03-08 MED ORDER — BACITRACIN ZINC 500 UNIT/GM EX OINT: TOPICAL_OINTMENT | Freq: Two times a day (BID) | CUTANEOUS | Status: DC

## 2024-03-08 NOTE — ED Provider Notes (Signed)
 [facility] Provider Note    Event Date/Time   First MD Initiated Contact with Patient 03/08/24 [phone]     (approximate)   History   Finger Injury   HPI  Tvisha Schwoerer is a 38 y.o. female right-hand-dominant with no significant past medical history who presents to the emergency department with pain and swelling to the right third fingertip.  States she did have some draining from the cuticle of purulent drainage but states that the swelling of her finger has gotten worse.  No fevers.  No injury.   History provided by patient.    History reviewed. No pertinent past medical history.  History reviewed. No pertinent surgical history.  MEDICATIONS:  Prior to Admission medications   Medication Sig Start Date End Date Taking? Authorizing Provider  cephALEXin  (KEFLEX ) 500 MG capsule Take 1 capsule (500 mg total) by mouth 4 (four) times daily for 7 days. 03/08/24 03/15/24 Yes Emanual Lamountain, Clover Dao, DO  promethazine  (PHENERGAN ) 12.5 MG tablet Take 1 tablet (12.5 mg total) by mouth every 6 (six) hours as needed for nausea or vomiting. Patient not taking: Reported on 02/26/2024 12/20/15   Dannial Duty, MD    Physical Exam   Triage Vital Signs: ED Triage Vitals  Encounter Vitals Group     BP 03/08/24 0158 108/81     Systolic BP Percentile --      Diastolic BP Percentile --      Pulse Rate 03/08/24 0158 82     Resp 03/08/24 0158 18     Temp 03/08/24 0158 97.8 F (36.6 C)     Temp Source 03/08/24 0158 Oral     SpO2 03/08/24 0158 99 %     Weight 03/08/24 0157 235 lb (106.6 kg)     Height 03/08/24 0157 5\' 6"  (1.676 m)     Head Circumference --      Peak Flow --      Pain Score 03/08/24 0156 7     Pain Loc --      Pain Education --      Exclude from Growth Chart --     Most recent vital signs: Vitals:   03/08/24 0439 03/08/24 0628  BP:  119/79  Pulse:  65  Resp:  18  Temp:    SpO2: 99% 100%     CONSTITUTIONAL: Alert and responds appropriately to  questions. Well-appearing; well-nourished HEAD: Normocephalic, atraumatic EYES: Conjunctivae clear, pupils appear equal ENT: normal nose; moist mucous membranes NECK: Normal range of motion CARD: Regular rate and rhythm RESP: Normal chest excursion without splinting or tachypnea; no hypoxia or respiratory distress, speaking full sentences ABD/GI: non-distended EXT: Patient has a large paronychia with extension of cellulitis to the dorsal fingertip that does not involve the palm of the hand.  There is no tenderness over the flexor tendon and she is able to fully flex and extend the fingers of the right hand.  Normal capillary refill.  2+ right radial pulse. SKIN: Normal color for age and race, no rashes on exposed skin NEURO: Moves all extremities equally, normal speech, no facial asymmetry noted PSYCH: The patient's mood and manner are appropriate. Grooming and personal hygiene are appropriate.  ED Results / Procedures / Treatments   LABS: (all labs ordered are listed, but only abnormal results are displayed) Labs Reviewed - No data to display   EKG:  EKG Interpretation Date/Time:    Ventricular Rate:    PR Interval:    QRS Duration:  QT Interval:    QTC Calculation:   R Axis:      Text Interpretation:            RADIOLOGY: My personal review and interpretation of imaging:    I have personally reviewed all radiology reports. No results found.   PROCEDURES:  Critical Care performed: No   INCISION AND DRAINAGE Performed by: Starling Eck Brienna Bass Consent: Verbal consent obtained. Risks and benefits: risks, benefits and alternatives were discussed Type: abscess  Body area: Right third finger  Anesthesia: local infiltration  Incision was made with a scalpel.  Local anesthetic: lidocaine  1% without epinephrine  Anesthetic total: 3 ml  Complexity: Simple  Drainage: purulent  Drainage amount: Small  Patient tolerance: Patient tolerated the procedure well with  no immediate complications.     Procedures    IMPRESSION / MDM / ASSESSMENT AND PLAN / ED COURSE  I reviewed the triage vital signs and the nursing notes.   Patient here with paronychia to the right third finger with some extension of cellulitis into the dorsal hand.  There is no sign of infection involving the pulp of the fingertip or over the flexor tendon.     DIFFERENTIAL DIAGNOSIS (includes but not limited to):   Paronychia, early finger cellulitis, no sign of flexor tenosynovitis, herpetic whitlow, doubt osteomyelitis  Patient's presentation is most consistent with acute complicated illness / injury requiring diagnostic workup.  PLAN: Patient agrees to perform incision and drainage of the paronychia.  Given there is some extension around this area of redness and warmth, will start her on Keflex  as well.  No involvement over the flexor tendon.  Neurovascular intact distally with normal cap refill.   MEDICATIONS GIVEN IN ED: Medications  bacitracin  ointment (1 Application Topical Given 03/08/24 0617)  ibuprofen  (ADVIL ) tablet 800 mg (800 mg Oral Given 03/08/24 0512)  cephALEXin  (KEFLEX ) capsule 500 mg (500 mg Oral Given 03/08/24 0512)  lidocaine  (PF) (XYLOCAINE ) 1 % injection 5 mL (5 mLs Intradermal Given by Other 03/08/24 0617)     ED COURSE: Patient tolerated incision and drainage with some difficulty due to anxiety.  She reports feeling better however after local anesthetic and drainage.  Will apply bacitracin , dressing and discharged with Keflex .  Recommended warm soaks, alternating Tylenol and Motrin .  Recommended follow-up if symptoms not improving.   At this time, I do not feel there is any life-threatening condition present. I reviewed all nursing notes, vitals, pertinent previous records.  All lab and urine results, EKGs, imaging ordered have been independently reviewed and interpreted by myself.  I reviewed all available radiology reports from any imaging ordered this  visit.  Based on my assessment, I feel the patient is safe to be discharged home without further emergent workup and can continue workup as an outpatient as needed. Discussed all findings, treatment plan as well as usual and customary return precautions.  They verbalize understanding and are comfortable with this plan.  Outpatient follow-up has been provided as needed.  All questions have been answered.    CONSULTS:  none   OUTSIDE RECORDS REVIEWED: Reviewed last OB/GYN note in 2018.     FINAL CLINICAL IMPRESSION(S) / ED DIAGNOSES   Final diagnoses:  Paronychia of finger of right hand  Cellulitis of finger of right hand     Rx / DC Orders   ED Discharge Orders          Ordered    cephALEXin  (KEFLEX ) 500 MG capsule  4 times daily  03/08/24 0615             Note:  This document was prepared using Dragon voice recognition software and may include unintentional dictation errors.   Hipolito Martinezlopez, Clover Dao, [place] 03/08/24 [phone]

## 2024-03-08 NOTE — ED Triage Notes (Addendum)
 Pt reports right middle finger swelling, pt states it started with a hang nail, pts tip of finger is extremely swollen. Pt reports discharge from finger. Symptoms began 1 week ago

## 2024-03-08 NOTE — Discharge Instructions (Addendum)
You may alternate Tylenol 1000 mg every 6 hours as needed for pain, fever and Ibuprofen 800 mg every 6-8 hours as needed for pain, fever.  Please take Ibuprofen with food.  Do not take more than 4000 mg of Tylenol (acetaminophen) in a 24 hour period. ° °

## 2024-03-24 ENCOUNTER — Other Ambulatory Visit (HOSPITAL_COMMUNITY)
Admission: RE | Admit: 2024-03-24 | Discharge: 2024-03-24 | Disposition: A | Discharge status: Home / Self Care | Source: Ambulatory Visit | Attending: Pediatrics | Admitting: Pediatrics

## 2024-03-24 ENCOUNTER — Ambulatory Visit (INDEPENDENT_AMBULATORY_CARE_PROVIDER_SITE_OTHER): Admitting: Pediatrics

## 2024-03-24 ENCOUNTER — Encounter: Payer: Self-pay | Admitting: Pediatrics

## 2024-03-24 VITALS — BP 114/73 | HR 67 | Temp 97.9°F | Ht 66.0 in | Wt 236.6 lb

## 2024-03-24 DIAGNOSIS — Z6838 Body mass index (BMI) 38.0-38.9, adult: Secondary | ICD-10-CM | POA: Diagnosis not present

## 2024-03-24 DIAGNOSIS — Z1330 Encounter for screening examination for mental health and behavioral disorders, unspecified: Secondary | ICD-10-CM | POA: Diagnosis not present

## 2024-03-24 DIAGNOSIS — Z6835 Body mass index (BMI) 35.0-35.9, adult: Secondary | ICD-10-CM | POA: Insufficient documentation

## 2024-03-24 DIAGNOSIS — Z124 Encounter for screening for malignant neoplasm of cervix: Secondary | ICD-10-CM | POA: Diagnosis not present

## 2024-03-24 DIAGNOSIS — E611 Iron deficiency: Secondary | ICD-10-CM

## 2024-03-24 DIAGNOSIS — L03011 Cellulitis of right finger: Secondary | ICD-10-CM | POA: Diagnosis not present

## 2024-03-24 DIAGNOSIS — Z0000 Encounter for general adult medical examination without abnormal findings: Secondary | ICD-10-CM

## 2024-03-24 MED ORDER — TRIAMCINOLONE ACETONIDE 0.025 % EX OINT: 1.0000 | TOPICAL_OINTMENT | Freq: Two times a day (BID) | CUTANEOUS | 0 refills | Status: DC

## 2024-03-24 MED ORDER — WEGOVY 0.25 MG/0.5ML ~~LOC~~ SOAJ: 0.2500 mg | SUBCUTANEOUS | 0 refills | Status: DC

## 2024-03-24 NOTE — Progress Notes (Signed)
 BP 114/73   Pulse 67   Temp 97.9 F (36.6 C) (Oral)   Ht 5\' 6"  (1.676 m)   Wt 236 lb 9.6 oz (107.3 kg)   LMP 03/03/2024   SpO2 97%   BMI 38.19 kg/m    Annual Physical Exam - Female  Subjective:   CC: Annual Exam   Cynthia Rice is a 38 y.o. female patient here for a preventative health maintenance exam. Additional topics discussed include:  #paronychia Went to ER Still having some dryness and skin breakdown  Health Habits: DIET: in general, an "unhealthy" diet, interested in wt management med EXERCISE: minimal DENTAL EXAM: Due EYE EXAM: Not applicable                       Relevant Gynecologic History LMP: Patient's last menstrual period was 03/03/2024.  Menstrual Status: premenopausal, Flow regular every month without intermenstrual spotting PAP History:  No Cervical Cancer Screening results to display.  History abnormal PAP: No  Domestic Violence Screen, feels safe at home: Yes  family history includes Cancer in her mother; Diabetes in her maternal grandmother; Heart disease in her paternal grandmother.  Social History   Tobacco Use   Smoking status: Never  Substance Use Topics   Alcohol use: Not Currently   Drug use: Yes    Types: Marijuana   Social History   Social History Narrative   Not on file    Social drivers questionnaire is reviewed and is positive for: none  Depression Screening:     03/24/2024   10:56 AM 03/24/2024   10:52 AM 02/26/2024    1:52 PM  Depression screen PHQ 2/9  Decreased Interest 0 0 0  Down, Depressed, Hopeless 0 0 1  PHQ - 2 Score 0 0 1  Altered sleeping 0 0 0  Tired, decreased energy 0 0 2  Change in appetite 0 0 0  Feeling bad or failure about yourself  0 0 0  Trouble concentrating 0 0 0  Moving slowly or fidgety/restless 0 0 0  Suicidal thoughts 0 0 0  PHQ-9 Score 0 0 3  Difficult doing work/chores Not difficult at all Not difficult at all Not difficult at all       03/24/2024   10:57 AM 03/24/2024   10:52 AM  02/26/2024    1:52 PM  GAD 7 : Generalized Anxiety Score  Nervous, Anxious, on Edge 0 0 0  Control/stop worrying 0 0 0  Worry too much - different things 0 0 0  Trouble relaxing 0 0 0  Restless 0 0 0  Easily annoyed or irritable 0 0 1  Afraid - awful might happen 0 0 0  Total GAD 7 Score 0 0 1  Anxiety Difficulty Not difficult at all Not difficult at all Not difficult at all    Mental Health Plan: psychology today for grief pending  Self Management Goals  Goals   None     Health Maintenance Colon Cancer Screening : Not applicable Mammogram : Not applicable DXA scan : Not applicable Immunizations : up to date and documented  Review of Systems See HPI for relevant ROS.  Outpatient Medications Prior to Visit  Medication Sig Dispense Refill   promethazine  (PHENERGAN ) 12.5 MG tablet Take 1 tablet (12.5 mg total) by mouth every 6 (six) hours as needed for nausea or vomiting. (Patient not taking: Reported on 03/24/2024) 30 tablet 0   No facility-administered medications prior to visit.  Patient Active Problem List   Diagnosis Date Noted   BMI 38.0-38.9,adult 03/24/2024   Iron deficiency 03/01/2024    Objective:   Vitals:   03/24/24 1044  BP: 114/73  Pulse: 67  Temp: 97.9 F (36.6 C)  Height: 5\' 6"  (1.676 m)  Weight: 236 lb 9.6 oz (107.3 kg)  SpO2: 97%  TempSrc: Oral  BMI (Calculated): 38.21    Body mass index is 38.19 kg/m.  Physical Exam Exam conducted with a chaperone present.  Constitutional:      Appearance: Normal appearance.  HENT:     Head: Normocephalic and atraumatic.  Eyes:     Pupils: Pupils are equal, round, and reactive to light.  Cardiovascular:     Rate and Rhythm: Normal rate and regular rhythm.     Pulses: Normal pulses.     Heart sounds: Normal heart sounds.  Pulmonary:     Effort: Pulmonary effort is normal.     Breath sounds: Normal breath sounds.  Abdominal:     General: Abdomen is flat.     Palpations: Abdomen is soft.   Genitourinary:    General: Normal vulva.     Exam position: Lithotomy position.     Vagina: Normal.     Cervix: Normal.  Musculoskeletal:        General: Normal range of motion.     Cervical back: Normal range of motion.  Skin:    General: Skin is warm and dry.     Capillary Refill: Capillary refill takes less than 2 seconds.  Neurological:     General: No focal deficit present.     Mental Status: She is alert. Mental status is at baseline.  Psychiatric:        Mood and Affect: Mood normal.        Behavior: Behavior normal.   Assessment and Plan:   Annual physical exam Discussed lifestyle modifications and goals including plant based eating styles (such as: Mediterranean eating style), regular exercise (at least 150 min of moderate-intensity aerobic exercise per week, given AHA workout handout), get adequate sleep, and continue working with PCP towards meeting health goals to ensure healthy aging.   BMI 38.0-38.9,adult Discuss wt management meds as an option as she continues lifestyle modifications. Elevated LDL otherwise no comorbidities.  Frederik Jansky; Inject 0.25 mg into the skin once a week.  Dispense: 2 mL; Refill: 0  Iron deficiency Assessment & Plan: Start iron supp, at least every other day.  Encounter for behavioral health screening As part of their intake evaluation, the patient was screened for depression, anxiety.  PHQ9 SCORE 0, GAD7 SCORE 0. Screening results negative for tested conditions. See plan under problem/diagnosis above.  Paronychia of finger of right hand Will give steroid cream to help expedite healing. -     Triamcinolone Acetonide; Apply 1 Application topically 2 (two) times daily.  Dispense: 30 g; Refill: 0  Screening for cervical cancer -     Cytology - PAP    Clinical coverage for weight loss GLP's  Medication being dispensed is Wegovy 3 mL/28 day. Titration doses are 2 mL/28 days.  [x]  Product being prescribed is FDA approved for the  indication, age, weight (if applicable) and not does not exceed dosing limits per the Prescribing Information per the clinical conditions for use.  [x]  Patient's baseline weight measured within the last 45 days as required by provider before dispensing.  [x]  Patient is new to therapy and One of the following:   []   The beneficiary is 37 years of age or over and has ONE of the following:  []  A BMI greater than or equal to 30 kg/m2  []  A BMI greater than or equal to 27 kg/m2 with at least one weight-related comorbidity/risk factor/complication (i.e. hypertension, type 2 diabetes, obstructive sleep apnea, cardiovascular disease, dyslipidemia)  [x]  The beneficiary is currently on and will continue lifestyle modification including structured nutrition and physical activity, unless physical activity is not clinically appropriate at the time GLP1 therapy commences AND  [x]  The beneficiary will NOT be using the requested agent in combination with another GLP-1 receptor agonist agent AND  [x]  The beneficiary does NOT have any FDA-labeled contraindications to the requested agent, including pregnancy, lactation, history of medullary thyroid  cancer or multiple endocrine neoplasia type II.   Last BMI/Weight/Height recorded Estimated body mass index is 38.19 kg/m as calculated from the following:   Height as of this encounter: 5\' 6"  (1.676 m).   Weight as of this encounter: 236 lb 9.6 oz (107.3 kg).   This plan was discussed with the patient and questions were answered. There were no further concerns.  Follow up as indicated, or sooner should any new problems arise, if conditions worsen, or if they are otherwise concerned.   See patient instructions for additional information.  Hadassah Letters, MD  Family Medicine      No future appointments.

## 2024-03-24 NOTE — Assessment & Plan Note (Signed)
 Start iron supp, at least every other day.

## 2024-03-24 NOTE — Patient Instructions (Addendum)
 The website we discussed today: [email or web address] - to help find a therapist, be sure to filter through zip code, insurance. You should be able to contact them directly from the website through phone or email. Let me know if having trouble finding someone and will help where I can!     Weight medications: Injectable medications: GLP1 agonist like ozempic, wegovy, zepbound, mounjaro  Once weekly injections Most common side effects: nausea, diarrhea, abdominal pain, constipation, acid reflux Higher risk for pancreatitis (inflammation of pancreas) - if developed this while on the medication would stop it Contraindications: history of medullary thyroid  cancer, MEN2 disorders, pregnancy Depending on which injection, can see between 14-26% body weight loss Oral medications - can do single pill or combined as below Welbutrin/naltrexone SE: headaches, insomnia, increased anxiety 8-10% weight loss Phentermine/topiramate SE: headaches, insomnia, increased anxiety 8-10% weight loss With topiramate: nausea is most common Orlistat SE: greasy stools, abdominal pain 2% weight loss  [employer] Phone Number Practice Name  [place] [facility] [address] [place] [phone] Lenon Radar CRISP DDS MS PA  [place] [facility] [address] [place] [phone] Alma Argyle DDS PA III  [place] General Dentist [address] [place] [phone] [facility]  [place] General Dentist [address] [place] [phone] [facility]  [place] General Dentist [address] [place] [phone] Adolph Aid IV DMD PA  [place] [facility] Dentist [address] [place] [phone] [facility]  [place] General Dentist [address] [place] [phone] [facility] General Dentist [address] [place] [phone] Jayne Mews  [place]  General Dentist [address] [place] [phone] [facility] [place]  [place] [address] [place] [phone] [facility]  [place] Oral Surgeon [address] Rosena Conradi [phone] KERVIN MACK DMD MS [facility]  [place] Pediatric Dentist [address] [place] [phone] [place] [facility]

## 2024-03-28 LAB — CYTOLOGY - PAP
Comment: NEGATIVE
Diagnosis: NEGATIVE
High risk HPV: NEGATIVE

## 2024-03-29 ENCOUNTER — Ambulatory Visit: Payer: Self-pay | Admitting: Pediatrics

## 2024-04-06 NOTE — Progress Notes (Signed)
 PA approved.

## 2024-05-12 ENCOUNTER — Other Ambulatory Visit: Payer: Self-pay | Admitting: Pediatrics

## 2024-05-12 DIAGNOSIS — Z6838 Body mass index (BMI) 38.0-38.9, adult: Secondary | ICD-10-CM

## 2024-05-13 NOTE — Telephone Encounter (Signed)
 Requested Prescriptions  Pending Prescriptions Disp Refills   WEGOVY  0.25 MG/0.5ML SOAJ [Pharmacy Med Name: Wegovy  0.25 MG/0.5ML Subcutaneous Solution Auto-injector] 4 mL 0    Sig: INJECT ONE SYRINGEFUL INTO THE SKIN ONCE WEEKLY     Endocrinology:  Diabetes - GLP-1 Receptor Agonists - semaglutide  Failed - 05/13/2024 12:19 PM      Failed - Valid encounter within last 6 months    Recent Outpatient Visits           1 month ago Annual physical exam   [place] [facility] Herold Hadassah SQUIBB, MD   2 months ago History of iron deficiency anemia   [place] [facility] Herold Hadassah SQUIBB, MD              Passed - HBA1C in normal range and within 180 days    Hgb A1c MFr Bld  Date Value Ref Range Status  02/26/2024 5.4 4.8 - 5.6 % Final    Comment:             Prediabetes: 5.7 - 6.4          Diabetes: >6.4          Glycemic control for adults with diabetes: <7.0          Passed - Cr in normal range and within 360 days    Creatinine, Ser  Date Value Ref Range Status  02/26/2024 0.74 0.57 - 1.00 mg/dL Final

## 2024-06-13 ENCOUNTER — Encounter: Payer: Self-pay | Admitting: Pediatrics

## 2024-06-13 ENCOUNTER — Ambulatory Visit: Admitting: Pediatrics

## 2024-06-13 VITALS — BP 113/76 | HR 69 | Temp 97.9°F | Wt 229.4 lb

## 2024-06-13 DIAGNOSIS — E611 Iron deficiency: Secondary | ICD-10-CM | POA: Diagnosis not present

## 2024-06-13 DIAGNOSIS — Z6838 Body mass index (BMI) 38.0-38.9, adult: Secondary | ICD-10-CM | POA: Diagnosis not present

## 2024-06-13 DIAGNOSIS — M722 Plantar fascial fibromatosis: Secondary | ICD-10-CM | POA: Diagnosis not present

## 2024-06-13 MED ORDER — WEGOVY 1 MG/0.5ML ~~LOC~~ SOAJ: 1.0000 mg | SUBCUTANEOUS | 0 refills | Status: DC

## 2024-06-13 MED ORDER — WEGOVY 0.25 MG/0.5ML ~~LOC~~ SOAJ
0.5000 mg | SUBCUTANEOUS | 0 refills | Status: DC
Start: 2024-06-13 — End: 2024-09-16

## 2024-06-13 MED ORDER — WEGOVY 1.7 MG/0.75ML ~~LOC~~ SOAJ
1.7000 mg | SUBCUTANEOUS | 1 refills | Status: DC
Start: 2024-06-13 — End: 2024-09-16

## 2024-06-13 NOTE — Progress Notes (Signed)
 Office Visit  BP 113/76   Pulse 69   Temp 97.9 F (36.6 C) (Oral)   Wt 229 lb 6.4 oz (104.1 kg)   LMP 05/22/2024   SpO2 99%   BMI 37.03 kg/m    Subjective:    Patient ID: Cynthia Rice, female    DOB: [birth year], 38 y.o.   MRN: [id number]  HPI: Cynthia Rice is a 38 y.o. female  Chief Complaint  Patient presents with   Medication Management    Discussed the use of AI scribe software for clinical note transcription with the patient, who gave verbal consent to proceed.  History of Present Illness   Cynthia Rice is a 38 year old female who presents for follow-up regarding weight management and medication side effects.  She has been on her current weight management medication for approximately three months, administering the lowest dose weekly in her thighs. She experienced a single episode of vomiting and feeling unwell after consuming fast food in June, which she attributes to overeating. Since then, she has avoided fast food and has not had further episodes. She has not weighed herself since her last visit but reports noticeable weight loss and is satisfied with the current pace, aiming for one to two pounds per week.  Her dietary habits include consuming protein-rich snacks like granola bars and peanut butter, aiming for 10 to 20 grams of protein per snack. She is mindful of avoiding high-fat foods. Her brother, an athlete, advises her on protein intake.  She has a history of low iron levels but is not anemic. She has not been taking iron supplements regularly due to gastrointestinal discomfort. She takes a daily multivitamin to support her nutritional needs.  She reports foot pain, particularly in the heel and arch area, which has been ongoing for months. 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 She suspects plantar fasciitis and notes that her shoes may lack adequate arch support.        Relevant past  medical, surgical, family and social history reviewed and updated as indicated. Interim medical history since our last visit reviewed. Allergies and medications reviewed and updated.  ROS per HPI unless specifically indicated above     Objective:    BP 113/76   Pulse 69   Temp 97.9 F (36.6 C) (Oral)   Wt 229 lb 6.4 oz (104.1 kg)   LMP 05/22/2024   SpO2 99%   BMI 37.03 kg/m   Wt Readings from Last 3 Encounters:  06/13/24 229 lb 6.4 oz (104.1 kg)  03/24/24 236 lb 9.6 oz (107.3 kg)  03/08/24 235 lb (106.6 kg)     Physical Exam Constitutional:      Appearance: Normal appearance.  Pulmonary:     Effort: Pulmonary effort is normal.  Musculoskeletal:        General: Normal range of motion.  Skin:    Comments: Normal skin color  Neurological:     General: No focal deficit present.     Mental Status: She is alert. Mental status is at baseline.  Psychiatric:        Mood and Affect: Mood normal.        Behavior: Behavior normal.        Thought Content: Thought content normal.         06/13/2024    9:08 AM 03/24/2024   10:56 AM 03/24/2024   10:52 AM 02/26/2024  1:52 PM  Depression screen PHQ 2/9  Decreased Interest 0 0 0 0  Down, Depressed, Hopeless 0 0 0 1  PHQ - 2 Score 0 0 0 1  Altered sleeping 0 0 0 0  Tired, decreased energy 0 0 0 2  Change in appetite 0 0 0 0  Feeling bad or failure about yourself  0 0 0 0  Trouble concentrating 0 0 0 0  Moving slowly or fidgety/restless 0 0 0 0  Suicidal thoughts 0 0 0 0  PHQ-9 Score 0 0 0 3  Difficult doing work/chores Not difficult at all Not difficult at all Not difficult at all Not difficult at all       06/13/2024    9:08 AM 03/24/2024   10:57 AM 03/24/2024   10:52 AM 02/26/2024    1:52 PM  GAD 7 : Generalized Anxiety Score  Nervous, Anxious, on Edge 0 0 0 0  Control/stop worrying 0 0 0 0  Worry too much - different things 0 0 0 0  Trouble relaxing 0 0 0 0  Restless 0 0 0 0  Easily annoyed or irritable 0 0 0 1   Afraid - awful might happen 0 0 0 0  Total GAD 7 Score 0 0 0 1  Anxiety Difficulty Not difficult at all Not difficult at all Not difficult at all Not difficult at all       Assessment & Plan:  Assessment & Plan   BMI 38.0-38.9,adult Assessment & Plan: She is on a weight management program with weekly injections at the lowest dose, resulting in a weight loss of 1-2 pounds per week. Discussed protein intake and managing potential side effects. Continue weekly injections and consider changing the injection site. Maintain protein intake with snacks containing 10-20 grams of protein. Consider a nutritionist referral for meal planning. Follow-up in three months.  Orders: -     Wegovy ; Inject 0.5 mg into the skin once a week.  Dispense: 2 mL; Refill: 0 -     Wegovy ; Inject 1 mg into the skin once a week.  Dispense: 2 mL; Refill: 0 -     Wegovy ; Inject 1.7 mg into the skin once a week.  Dispense: 3 mL; Refill: 1  Iron deficiency Assessment & Plan: She has low iron levels without anemia. Advised to take iron supplements every other day to minimize gastrointestinal side effects. Re-evaluate iron levels in three months.   Plantar fasciitis Heel pain consistent with plantar fasciitis is exacerbated by prolonged standing and inadequate arch support. Advised to use insoles and ensure shoes have good arch support. Consider a podiatrist referral if symptoms persist.   Follow up plan: Return in about 3 months (around 09/13/2024) for wt management.  Hadassah SHAUNNA Nett, MD

## 2024-06-13 NOTE — Assessment & Plan Note (Signed)
 She is on a weight management program with weekly injections at the lowest dose, resulting in a weight loss of 1-2 pounds per week. Discussed protein intake and managing potential side effects. Continue weekly injections and consider changing the injection site. Maintain protein intake with snacks containing 10-20 grams of protein. Consider a nutritionist referral for meal planning. Follow-up in three months.

## 2024-06-13 NOTE — Assessment & Plan Note (Signed)
 She has low iron levels without anemia. Advised to take iron supplements every other day to minimize gastrointestinal side effects. Re-evaluate iron levels in three months.

## 2024-06-13 NOTE — Patient Instructions (Addendum)
 Wegovy dosing schedule:    Go to following website for details about administration, dosing, possible side effects, and risks (these are same as what I told you at our visit but just a reminder if you need it):  [email or web address]  [email or web address]  If you have increased nausea, you can remain at the highest dose you can tolerate until you have your follow up visit.  If you develop severe nausea, vomiting or abdominal pain, you should stop the medication and call our office.  As we discussed, the most common side effects of this medication are nausea and acid reflux. This can be reduced with slow titration of the medication, eating smaller meals to gage how you will respond tot he medication, and not eating right before laying down.   Other common complaints are constipation which can be reduced by making sure you are getting enough fiber and water. Adding an over the counter fiber supplement may help as well. Hydration is important. Because you get fuller easier, making sure you get enough water may prevent dehydration. This is especially important if you have any chronic kidney disease.   Please remember, that like all weight loss medications, this medication is contraindicated in pregnancy. Please reach out to discuss prior to getting pregnant and continue to use birth control methods.   With higher doses of Wegovy, people report more nausea on the day of and day after the injection.   This is thought to be due to the effects it has on the upper GI tract and stomach and stretching of the stomach too much or too fast.   Tips:   -- eat smaller meals (3 per day) --add a small snack between meals if needed  --eat slower --don't gulp large amounts of liquid at one time -- sips over the course of the day to make sure you are not getting dehydrated.    Some people do need antinausea medication.    Sometimes, people need to go down a step to a lower dose (you have a refill on the other steps) or stop the medication and restart the steps.   Please let me know if you are having worsening nausea and we can discuss options.    If you have further questions about this medication or need any assistance with dosing/administration please call our office so we may help you. If needed, we can schedule an office visit to address your needs that cannot be addressed by phone.

## 2024-07-09 ENCOUNTER — Other Ambulatory Visit: Payer: Self-pay | Admitting: Pediatrics

## 2024-07-09 DIAGNOSIS — Z6838 Body mass index (BMI) 38.0-38.9, adult: Secondary | ICD-10-CM

## 2024-07-11 NOTE — Telephone Encounter (Signed)
 Requested Prescriptions  Refused Prescriptions Disp Refills   WEGOVY  0.25 MG/0.5ML SOAJ SQ injection [Pharmacy Med Name: Wegovy  0.25 MG/0.5ML Subcutaneous Solution Auto-injector] 4 mL 0    Sig: INJECT 0.5 MG SUBCUTANEOUSLY ONCE A WEEK     Endocrinology:  Diabetes - GLP-1 Receptor Agonists - semaglutide  Passed - 07/11/2024  3:58 PM      Passed - HBA1C in normal range and within 180 days    Hgb A1c MFr Bld  Date Value Ref Range Status  02/26/2024 5.4 4.8 - 5.6 % Final    Comment:             Prediabetes: 5.7 - 6.4          Diabetes: >6.4          Glycemic control for adults with diabetes: <7.0          Passed - Cr in normal range and within 360 days    Creatinine, Ser  Date Value Ref Range Status  02/26/2024 0.74 0.57 - 1.00 mg/dL Final         Passed - Valid encounter within last 6 months    Recent Outpatient Visits           4 weeks ago BMI 38.0-38.9,adult   [place] [facility] Herold Hadassah SQUIBB, MD   3 months ago Annual physical exam   [place] [facility] Herold Hadassah SQUIBB, MD   4 months ago History of iron deficiency anemia   [place] [facility] [place] Herold Hadassah SQUIBB, MD

## 2024-07-21 ENCOUNTER — Telehealth: Payer: Self-pay

## 2024-07-21 NOTE — Telephone Encounter (Unsigned)
 Copied from CRM [id number]. Topic: Clinical - Prescription Issue >> Jul 21, 2024 11:45 AM Cynthia Rice wrote: Reason for CRM: Patient is needing information send to pharmacy concerning her   Semaglutide -Weight Management (WEGOVY , the script has been increased and pharmacy needs to confirm that the dosage of 1.7 is correct, because once the dosage is increased it can't be lowered , pharmacy states they reached out last week.

## 2024-07-22 ENCOUNTER — Other Ambulatory Visit: Payer: Self-pay | Admitting: Pediatrics

## 2024-07-22 DIAGNOSIS — Z6838 Body mass index (BMI) 38.0-38.9, adult: Secondary | ICD-10-CM

## 2024-07-22 MED ORDER — SEMAGLUTIDE-WEIGHT MANAGEMENT 0.5 MG/0.5ML ~~LOC~~ SOAJ: 0.5000 mg | SUBCUTANEOUS | 0 refills | Status: DC

## 2024-08-14 ENCOUNTER — Other Ambulatory Visit: Payer: Self-pay | Admitting: Pediatrics

## 2024-08-14 DIAGNOSIS — Z6838 Body mass index (BMI) 38.0-38.9, adult: Secondary | ICD-10-CM

## 2024-08-16 NOTE — Telephone Encounter (Signed)
 Requested Prescriptions  Refused Prescriptions Disp Refills   WEGOVY  0.5 MG/0.5ML SOAJ SQ injection [Pharmacy Med Name: Wegovy  0.5 MG/0.5ML Subcutaneous Solution Auto-injector] 4 mL 0    Sig: INJECT 0.5MG  INTO THE SKIN ONCE A WEEK     Endocrinology:  Diabetes - GLP-1 Receptor Agonists - semaglutide  Passed - 08/16/2024  1:55 PM      Passed - HBA1C in normal range and within 180 days    Hgb A1c MFr Bld  Date Value Ref Range Status  02/26/2024 5.4 4.8 - 5.6 % Final    Comment:             Prediabetes: 5.7 - 6.4          Diabetes: >6.4          Glycemic control for adults with diabetes: <7.0          Passed - Cr in normal range and within 360 days    Creatinine, Ser  Date Value Ref Range Status  02/26/2024 0.74 0.57 - 1.00 mg/dL Final         Passed - Valid encounter within last 6 months    Recent Outpatient Visits           2 months ago BMI 38.0-38.9,adult   [place] [facility] Herold Hadassah SQUIBB, MD   4 months ago Annual physical exam   [place] [facility] Herold Hadassah SQUIBB, MD   5 months ago History of iron deficiency anemia   [place] [facility] Herold Hadassah SQUIBB, MD

## 2024-09-16 ENCOUNTER — Encounter: Payer: Self-pay | Admitting: Pediatrics

## 2024-09-16 ENCOUNTER — Ambulatory Visit: Admitting: Pediatrics

## 2024-09-16 VITALS — BP 108/67 | HR 86 | Temp 97.8°F | Ht 65.9 in | Wt 221.0 lb

## 2024-09-16 DIAGNOSIS — Z6835 Body mass index (BMI) 35.0-35.9, adult: Secondary | ICD-10-CM

## 2024-09-16 DIAGNOSIS — Z713 Dietary counseling and surveillance: Secondary | ICD-10-CM

## 2024-09-16 DIAGNOSIS — R4586 Emotional lability: Secondary | ICD-10-CM | POA: Diagnosis not present

## 2024-09-16 MED ORDER — PHENTERMINE HCL 15 MG PO CAPS: 15.0000 mg | ORAL_CAPSULE | ORAL | 0 refills | Status: DC

## 2024-09-16 NOTE — Patient Instructions (Addendum)
 Weight medications: Injectable medications: GLP1 agonist like ozempic , wegovy , zepbound, mounjaro  Once weekly injections Most common side effects: nausea, diarrhea, abdominal pain, constipation, acid reflux Higher risk for pancreatitis (inflammation of pancreas) - if developed this while on the medication would stop it Contraindications: history of medullary thyroid  cancer, MEN2 disorders, pregnancy Depending on which injection, can see between 14-26% body weight loss  Oral medications - can do single pill or combined as below Welbutrin/naltrexone SE: headaches, insomnia, increased anxiety 8-10% weight loss Phentermine/topiramate SE: headaches, insomnia, increased anxiety 8-10% weight loss With topiramate: nausea is most common Orlistat SE: greasy stools, abdominal pain 2% weight loss    Wegovy  self pay $499/mo through [employer] (manufacturer) - image below, shipped to you or picked up locally Self pay zepbound (tirzepatide) for them it's $329/mo 1st month then $499/mo thereafter - shipped to you [employer] like [employer] where you meet with a provider and they can prescribe compounded semaglutide  or tirzepatide that's shipped to you. I have a couple patients who use that service and they pay - attached image from their website.     Compounded company:

## 2024-09-16 NOTE — Progress Notes (Signed)
 Office Visit  BP 108/67   Pulse 86   Temp 97.8 F (36.6 C) (Oral)   Ht 5' 5.9 (1.674 m)   Wt 221 lb (100.2 kg)   SpO2 99%   BMI 35.78 kg/m    Subjective:    Patient ID: Cynthia Rice, female    DOB: Aug 23, 1986, 39 y.o.   MRN: [id number]  HPI: Cynthia Rice is a 38 y.o. female  Chief Complaint  Patient presents with   Obesity    Discussed the use of AI scribe software for clinical note transcription with the patient, who gave verbal consent to proceed.  History of Present Illness   Cynthia Rice is a 38 year old female who presents with concerns about weight management and mood changes following the loss of insurance coverage for Wegovy .  She is experiencing mood changes, feeling worried and somewhat depressed due to the loss of insurance coverage for Wegovy , a medication she was using for weight management. Despite looking into therapy, she has not yet found a therapist, citing life circumstances as a barrier.  She has experienced significant weight loss, noting a decrease from 236 pounds in May to 221 pounds currently. She acknowledges the weight loss but feels she has more to lose. She mentions that some of her clothing fits differently, indicating a noticeable change in her body.  She discusses her previous use of Wegovy , which she found effective for weight management. She expresses concern about the high cost of alternative medications and programs available outside of insurance coverage. She has not tried oral medications for weight management such as phentermine, topiramate, or Wellbutrin, and is considering these options. She is cautious about any impact on her mood, given her current concerns about depression.  During the review of symptoms, she reports feeling 'okay' overall but acknowledges ongoing mood challenges. She is attentive to potential mood changes with new medications, particularly increased anxiety or irritability.     Relevant past medical, surgical, family  and social history reviewed and updated as indicated. Interim medical history since our last visit reviewed. Allergies and medications reviewed and updated.  ROS per HPI unless specifically indicated above     Objective:    BP 108/67   Pulse 86   Temp 97.8 F (36.6 C) (Oral)   Ht 5' 5.9 (1.674 m)   Wt 221 lb (100.2 kg)   SpO2 99%   BMI 35.78 kg/m   Wt Readings from Last 3 Encounters:  09/16/24 221 lb (100.2 kg)  06/13/24 229 lb 6.4 oz (104.1 kg)  03/24/24 236 lb 9.6 oz (107.3 kg)     Physical Exam Constitutional:      Appearance: Normal appearance.  Pulmonary:     Effort: Pulmonary effort is normal.  Musculoskeletal:        General: Normal range of motion.  Skin:    Comments: Normal skin color  Neurological:     General: No focal deficit present.     Mental Status: She is alert. Mental status is at baseline.  Psychiatric:        Mood and Affect: Mood normal.        Behavior: Behavior normal.        Thought Content: Thought content normal.         09/16/2024    8:58 AM 06/13/2024    9:08 AM 03/24/2024   10:56 AM 03/24/2024   10:52 AM 02/26/2024    1:52 PM  Depression screen PHQ 2/9  Decreased Interest 0 0  0 0 0  Down, Depressed, Hopeless 0 0 0 0 1  PHQ - 2 Score 0 0 0 0 1  Altered sleeping 0 0 0 0 0  Tired, decreased energy 0 0 0 0 2  Change in appetite 0 0 0 0 0  Feeling bad or failure about yourself  0 0 0 0 0  Trouble concentrating 0 0 0 0 0  Moving slowly or fidgety/restless 0 0 0 0 0  Suicidal thoughts 0 0 0 0 0  PHQ-9 Score 0 0 0 0 3  Difficult doing work/chores Not difficult at all Not difficult at all Not difficult at all Not difficult at all Not difficult at all       09/16/2024    8:58 AM 06/13/2024    9:08 AM 03/24/2024   10:57 AM 03/24/2024   10:52 AM  GAD 7 : Generalized Anxiety Score  Nervous, Anxious, on Edge 0 0 0 0  Control/stop worrying 0 0 0 0  Worry too much - different things 0 0 0 0  Trouble relaxing 0 0 0 0  Restless 0 0 0 0   Easily annoyed or irritable 0 0 0 0  Afraid - awful might happen 0 0 0 0  Total GAD 7 Score 0 0 0 0  Anxiety Difficulty Not difficult at all Not difficult at all Not difficult at all Not difficult at all       Assessment & Plan:  Assessment & Plan   BMI 35.0-35.9,adult Assessment & Plan: Good weight loss achieved with Wegovy , but coverage lost. Discussed alternative weight loss options, including GLP-1 agonists and oral medications. Explored self-pay programs and compounded medication versions. Discussed phentermine side effects and advised discontinuation if anxiety or mood worsens. - Consider self-pay programs for GLP-1 agonists. - Consider compounded medication versions   - Prescribe phentermine and monitor for side effects, especially mood changes. - Follow up in one month to assess phentermine effectiveness.  Orders: -     Phentermine HCl; Take 1 capsule (15 mg total) by mouth every morning.  Dispense: 30 capsule; Refill: 0  Mood changes Ongoing symptoms with no current therapy engagement. She has looked into therapy options. - Facilitate a referral to a therapist.  -     AMB Referral [facility] Care Management   Follow up plan: Return in about 4 weeks (around 10/14/2024).  Hadassah SHAUNNA Nett, MD  Approximately 30 minutes spent on patient encounter today including assessment, counseling, diagnosing, treatment plan development, and charting.

## 2024-09-16 NOTE — Assessment & Plan Note (Signed)
 Good weight loss achieved with Wegovy , but coverage lost. Discussed alternative weight loss options, including GLP-1 agonists and oral medications. Explored self-pay programs and compounded medication versions. Discussed phentermine side effects and advised discontinuation if anxiety or mood worsens. - Consider self-pay programs for GLP-1 agonists. - Consider compounded medication versions   - Prescribe phentermine and monitor for side effects, especially mood changes. - Follow up in one month to assess phentermine effectiveness.

## 2024-09-19 ENCOUNTER — Telehealth: Payer: Self-pay

## 2024-09-19 NOTE — Progress Notes (Signed)
 Complex Care Management Note  Care Guide Note 09/19/2024 Name: Cynthia Rice MRN: [id number] DOB: [birth year]  Cynthia Rice is a 38 y.o. year old female who sees Herold, Hadassah SQUIBB, MD for primary care. I reached out to Willeen Brain by phone today to offer complex care management services.  [name] was given information about Complex Care Management services today including:   The Complex Care Management services include support from the care team which includes your Nurse Care Manager, Clinical Social Worker, or Pharmacist.  The Complex Care Management team is here to help remove barriers to the health concerns and goals most important to you. Complex Care Management services are voluntary, and the patient may decline or stop services at any time by request to their care team member.   Complex Care Management Consent Status: Patient agreed to services and verbal consent obtained.   Follow up plan:  Telephone appointment with complex care management team member scheduled for:  10/19/2024  Encounter Outcome:  Patient Scheduled  Jeoffrey Buffalo , RMA     [place]  [facility], [facility] Guide  Direct Dial: [phone]  Website: [email or web address]

## 2024-09-22 ENCOUNTER — Other Ambulatory Visit (HOSPITAL_COMMUNITY): Payer: Self-pay

## 2024-09-23 ENCOUNTER — Other Ambulatory Visit (HOSPITAL_COMMUNITY): Payer: Self-pay

## 2024-10-18 ENCOUNTER — Ambulatory Visit: Admitting: Pediatrics

## 2024-10-18 ENCOUNTER — Encounter: Payer: Self-pay | Admitting: Pediatrics

## 2024-10-18 VITALS — BP 104/66 | HR 69 | Temp 97.9°F | Ht 66.0 in | Wt 225.0 lb

## 2024-10-18 DIAGNOSIS — Z6835 Body mass index (BMI) 35.0-35.9, adult: Secondary | ICD-10-CM

## 2024-10-18 DIAGNOSIS — J310 Chronic rhinitis: Secondary | ICD-10-CM

## 2024-10-18 MED ORDER — PHENTERMINE HCL 30 MG PO CAPS: 30.0000 mg | ORAL_CAPSULE | ORAL | 2 refills | Status: AC

## 2024-10-18 MED ORDER — FLUTICASONE PROPIONATE 50 MCG/ACT NA SUSP: 2.0000 | Freq: Every day | NASAL | 6 refills | Status: AC

## 2024-10-18 NOTE — Progress Notes (Signed)
 Office Visit  BP 104/66   Pulse 69   Temp 97.9 F (36.6 C) (Oral)   Ht 5' 6 (1.676 m)   Wt 225 lb (102.1 kg)   LMP 10/06/2024 (Approximate)   SpO2 98%   BMI 36.32 kg/m    Subjective:    Patient ID: Cynthia Rice, female    DOB: [birth year], 38 y.o.   MRN: [id number]  HPI: Cynthia Rice is a 38 y.o. female  No chief complaint on file.   Discussed the use of AI scribe software for clinical note transcription with the patient, who gave verbal consent to proceed.  History of Present Illness   Cynthia Rice is a 38 year old female who presents for follow-up regarding weight management and medication adjustment.  She is currently on the lowest dose of phentermine  for appetite suppression, which she feels is helping with her appetite. Despite this, she has gained weight recently, attributing it to holiday eating habits.  She has a history of using Wegovy  for weight management and is aware of a new self-pay option that is less expensive than before, though she has not made a decision regarding this option.  She has been experiencing congestion since the Friday after Thanksgiving, which she attributes to a possible cold. She has been taking allergy medication and experiences occasional coughing when congestion builds up. Her boyfriend was also sick recently, but she is unsure of his diagnosis. No feeling run down, and she believes it is not COVID or flu. She experiences sinus pressure and runny nose but feels generally okay.  She works at [employer] and wears a mask regularly due to frequent illness in the clinic environment. She has a virtual appointment scheduled with a therapist to assist in finding a therapist, indicating ongoing attention to her mental health needs.      Relevant past medical, surgical, family and social history reviewed and updated as indicated. Interim medical history since our last visit reviewed. Allergies and medications reviewed and updated.  ROS per HPI unless  specifically indicated above     Objective:    BP 104/66   Pulse 69   Temp 97.9 F (36.6 C) (Oral)   Ht 5' 6 (1.676 m)   Wt 225 lb (102.1 kg)   LMP 10/06/2024 (Approximate)   SpO2 98%   BMI 36.32 kg/m   Wt Readings from Last 3 Encounters:  10/18/24 225 lb (102.1 kg)  09/16/24 221 lb (100.2 kg)  06/13/24 229 lb 6.4 oz (104.1 kg)     Physical Exam Constitutional:      Appearance: Normal appearance.  Pulmonary:     Effort: Pulmonary effort is normal.  Musculoskeletal:        General: Normal range of motion.  Skin:    Comments: Normal skin color  Neurological:     General: No focal deficit present.     Mental Status: She is alert. Mental status is at baseline.  Psychiatric:        Mood and Affect: Mood normal.        Behavior: Behavior normal.        Thought Content: Thought content normal.         10/19/2024    2:17 PM 10/18/2024    2:25 PM 09/16/2024    8:58 AM 06/13/2024    9:08 AM 03/24/2024   10:56 AM  Depression screen PHQ 2/9  Decreased Interest 1 0 0 0 0  Down, Depressed, Hopeless 1 0 0 0 0  PHQ - 2 Score 2 0 0 0 0  Altered sleeping 0 0 0 0 0  Tired, decreased energy 1 0 0 0 0  Change in appetite 1 0 0 0 0  Feeling bad or failure about yourself  1 0 0 0 0  Trouble concentrating 1 0 0 0 0  Moving slowly or fidgety/restless 0 0 0 0 0  Suicidal thoughts 0 0 0 0 0  PHQ-9 Score 6 0 0  0  0   Difficult doing work/chores Somewhat difficult Not difficult at all Not difficult at all Not difficult at all Not difficult at all     Data saved with a previous flowsheet row definition       10/19/2024    2:25 PM 10/18/2024    2:25 PM 09/16/2024    8:58 AM 06/13/2024    9:08 AM  GAD 7 : Generalized Anxiety Score  Nervous, Anxious, on Edge 0 0 0 0  Control/stop worrying 1 0 0 0  Worry too much - different things 0 0 0 0  Trouble relaxing 1 0 0 0  Restless 0 0 0 0  Easily annoyed or irritable 1 0 0 0  Afraid - awful might happen 0 0 0 0  Total GAD 7 Score 3 0  0 0  Anxiety Difficulty Not difficult at all Not difficult at all Not difficult at all Not difficult at all       Assessment & Plan:  Assessment & Plan   Rhinitis, unspecified type Congestion likely due to a cold. Discussed Flonase  for symptom relief. - Prescribed Flonase  nasal spray. - Advised to monitor symptoms and report if not improving in a week.  -     Fluticasone  Propionate; Place 2 sprays into both nostrils daily.  Dispense: 16 g; Refill: 6  BMI 35.0-35.9,adult Currently on phentermine  for appetite suppression. Discussed increasing dose and Wegovy  as an alternative. - Increased phentermine  dose to 30 mg. - Scheduled follow-up appointment on December 30th or 31st to assess response. - Discussed Wegovy  self-pay option as an alternative treatment. -     Phentermine  HCl; Take 1 capsule (30 mg total) by mouth every morning.  Dispense: 30 capsule; Refill: 2  Follow up plan: Return in about 4 weeks (around 11/15/2024) for wt management.  Hadassah SHAUNNA Nett, MD

## 2024-10-19 ENCOUNTER — Other Ambulatory Visit: Payer: Self-pay

## 2024-10-19 ENCOUNTER — Telehealth: Payer: Self-pay | Admitting: *Deleted

## 2024-10-19 NOTE — Patient Instructions (Addendum)
 Visit Information  Ms. Bonifield was given information about Medicaid Managed Care team care coordination services as a part of their [employer] benefit.   If you would like to schedule transportation through your [facility] plan, please call the following number at least 2 days in advance of your appointment: [phone]  If you are experiencing a behavioral health crisis, call the [employer] [place]  Behavioral Health Crisis Line at [phone]). The line is available 24 hours a day, seven days a week.   by MyChart link.  Patient verbalizes understanding of instructions and care plan provided today and agrees to view in MyChart. Active MyChart status and patient understanding of how to access instructions and care plan via MyChart confirmed with patient.     Licensed Clinical Social Worker will follow up on 11/02/2024 at 2:00 PM  Olam Ally, MSW, LCSW [place]  [facility], [facility] Licensed Clinical Social Worker Direct Dial: [phone]   Following is a copy of your plan of care:    Goals Addressed                     This Visit's Progress     VBCI Social Work Care Plan: LCSW            Problems:             Disease Management support and education needs related to Depression: depressed mood   CSW Clinical Goal(s):             Over the next 90 days the Patient will work with Child Psychotherapist to address concerns related to mild depression with by seeking therapy.   Interventions:            Mental Health:  Evaluation of current treatment plan related to Depression: depressed mood Active listening / Reflection utilized Behavioral Activation reviewed Depression screen reviewed Discussed referral options to connect for ongoing therapy: Patient is seeking therapy Emotional Support Provided Motivational Interviewing employed PHQ2/PHQ9 completed and results  discussed Problem Solving /Task Center strategies reviewed Suicidal Ideation/Homicidal Ideation assessed: No plan or intent Crisis line discussed and number provided of 988 GAD 7 completed and results discussed. LCSW and patient discussed current coping skills. LCSW encouraged patient to continue to utilize coping skills LCSW reviewed SDOH screenings and patient is not seeking resources at this time for food insecurity and housing needs.   Patient Goals/Self-Care Activities:            Connect with provider for ongoing mental health treatment. Once referral is made.     Plan:             Telephone follow up appointment with care management team member scheduled for:  11/02/2024 at 2:00 PM

## 2024-10-19 NOTE — Patient Outreach (Signed)
 Complex Care Management   Visit Note  10/19/2024  Name:  Cynthia Rice MRN: [id number] DOB: 05-Jul-1986  Situation: Referral received for Complex Care Management related to Mental/Behavioral Health diagnosis mild depression. I obtained verbal consent from Patient.  Visit completed with Patient  on the phone. Patient is seeking counseling for her mental health concerns and is agreeable to therapy.  Background:   Past Medical History:  Diagnosis Date   Anemia affecting pregnancy in third trimester 08/17/2017   Depression complicating pregnancy, antepartum 06/18/2017   Migraine without aura and without status migrainosus, not intractable 08/15/2015   Sickle cell trait 09/12/2017    Assessment: Patient Reported Symptoms:  Cognitive Cognitive Status: No symptoms reported, Alert and oriented to person, place, and time, Normal speech and language skills Cognitive/Intellectual Conditions Management [RPT]: None reported or documented in medical history or problem list      Neurological Neurological Review of Symptoms: No symptoms reported    HEENT HEENT Symptoms Reported: No symptoms reported      Cardiovascular Cardiovascular Symptoms Reported: No symptoms reported    Respiratory Respiratory Symptoms Reported: No symptoms reported    Endocrine Endocrine Symptoms Reported: No symptoms reported Is patient diabetic?: No    Gastrointestinal Gastrointestinal Symptoms Reported: No symptoms reported      Genitourinary Genitourinary Symptoms Reported: No symptoms reported    Integumentary Integumentary Symptoms Reported: No symptoms reported    Musculoskeletal Musculoskelatal Symptoms Reviewed: No symptoms reported   Falls in the past year?: No Number of falls in past year: 1 or less Was there an injury with Fall?: No Fall Risk Category Calculator: 0 Patient Fall Risk Level: Low Fall Risk Patient at Risk for Falls Due to: No Fall Risks Fall risk Follow up: Falls evaluation completed   Psychosocial Psychosocial Symptoms Reported: Depression - if selected complete PHQ 2-9 Behavioral Management Strategies:  (Patient states that she is seeking counseling) Major Change/Loss/Stressor/Fears (CP): Death of a loved one, Relationship concerns Behaviors When Feeling Stressed/Fearful: Patient states she can become over stimulated Techniques to Cope with Loss/Stress/Change:  (Patient is seeking counseling) Quality of Family Relationships: stressful, supportive Do you feel physically threatened by others?: No    10/19/2024    PHQ2-9 Depression Screening   Little interest or pleasure in doing things Several days  Feeling down, depressed, or hopeless Several days  PHQ-2 - Total Score 2  Trouble falling or staying asleep, or sleeping too much Not at all  Feeling tired or having little energy Several days  Poor appetite or overeating  Several days  Feeling bad about yourself - or that you are a failure or have let yourself or your family down Several days  Trouble concentrating on things, such as reading the newspaper or watching television Several days  Moving or speaking so slowly that other people could have noticed.  Or the opposite - being so fidgety or restless that you have been moving around a lot more than usual Not at all  Thoughts that you would be better off dead, or hurting yourself in some way Not at all  PHQ2-9 Total Score 6  If you checked off any problems, how difficult have these problems made it for you to do your work, take care of things at home, or get along with other people Somewhat difficult  Depression Interventions/Treatment  (Patient is requesting assisting with therapy)    There were no vitals filed for this visit. Pain Scale: 0-10 Pain Score: 0-No pain  Medications Reviewed Today   Medications  were not reviewed in this encounter     Recommendation:   Continue Current Plan of Care  Follow Up Plan:   Telephone follow-up 11/02/2024 at 2:00  PM  Olam Ally, MSW, LCSW   [facility], [facility] Licensed Clinical Social Worker Direct Dial: [phone]

## 2024-10-25 ENCOUNTER — Encounter: Payer: Self-pay | Admitting: Pediatrics

## 2024-10-28 ENCOUNTER — Telehealth: Payer: Self-pay | Admitting: Pediatrics

## 2024-10-28 NOTE — Telephone Encounter (Signed)
 On this date 10/28/2024  paperwork has been received on patient's behalf. The paperwork has been received via fax to our office.. The paperwork that has been received is FMLA.   Paperwork to be returned via Fax.   Paperwork to be placed in providers incoming folder to be worked by their team. Jud is expected to be completed and returned 5-7 business days.

## 2024-10-31 NOTE — Telephone Encounter (Signed)
 Paperwork pending patient returning FMLA questionnaire that has been sent via mychart.

## 2024-11-02 ENCOUNTER — Telehealth

## 2024-11-02 NOTE — Telephone Encounter (Signed)
 error

## 2024-11-08 ENCOUNTER — Telehealth: Payer: Self-pay | Admitting: Pediatrics

## 2024-11-08 NOTE — Telephone Encounter (Signed)
 On this date 11/08/2024  paperwork has been received on patient's behalf. The paperwork has been received via fax to our office.. The paperwork that has been received is FMLA.   Paperwork to be returned via Fax.   Paperwork to be placed in providers incoming folder to be worked by their team. Jud is expected to be completed and returned 5-7 business days.

## 2024-11-11 NOTE — Telephone Encounter (Signed)
 Patient has been called and a message left for them to return the call to the office. Ok for E2C2 to review if/when they return the call. Please do not transfer to CAL rather send a CRM if needed only.   Additional questions for patient needing to be answered.  Is she working or not? If she is working then she only needs intermittent leave and is how she filled out her paperwork but she also states last day she worked was 11/08/2024.   Does she want to be taken out of work/has already been taken out of work?  Or does she want intermittent leave for up to 5 days every month?  Please ask the questions and send CRM with answers.

## 2024-11-14 NOTE — Telephone Encounter (Signed)
 Copied from CRM #[id number]. Topic: General - Other >> Nov 11, 2024  2:03 PM Charlet HERO wrote:      Additional questions for patient needing to be answered.   Is she working or not?  Yes ,only needs intermittent leave and is how she filled out her paperwork but she also states last day she worked was 11/08/2024. She is stating that she is at work now but she put that date bc it was the last day she was previously at work   Does she want to be taken out of work/has already been taken out of work? Hasn't been taken out of work, she states that she needs it for when she has an episode that it does not get held against, if she needs to come late or be off for the day. Or does she want intermittent leave for up to 5 days every month? She says yes.

## 2024-11-16 ENCOUNTER — Encounter: Payer: Self-pay | Admitting: Pediatrics

## 2024-11-16 ENCOUNTER — Telehealth: Admitting: Pediatrics

## 2024-11-16 DIAGNOSIS — F439 Reaction to severe stress, unspecified: Secondary | ICD-10-CM | POA: Diagnosis not present

## 2024-11-16 DIAGNOSIS — Z636 Dependent relative needing care at home: Secondary | ICD-10-CM | POA: Insufficient documentation

## 2024-11-16 DIAGNOSIS — Z6835 Body mass index (BMI) 35.0-35.9, adult: Secondary | ICD-10-CM

## 2024-11-16 NOTE — Assessment & Plan Note (Signed)
 Management with phentermine  ongoing. Discussed Wegovy  and a new oral weight loss medication as options for self-pay. - Continue phentermine  30 mg. - Sent information about Wegovy  and the new oral weight loss medication. - Schedule follow-up with Darice in February for potential phentermine  dose adjustment.

## 2024-11-16 NOTE — Progress Notes (Signed)
 "  Telehealth Visit  I connected with  Cynthia Rice on 11/16/2024 by a video enabled telemedicine application and verified that I am speaking with the correct person using two identifiers.   I discussed the limitations of evaluation and management by telemedicine. The patient expressed understanding and agreed to proceed.  Subjective:    Patient ID: Cynthia Rice, female    DOB: 01-01-1986, 38 y.o.   MRN: [id number]  HPI: Cynthia Rice is a 38 y.o. female  Chief Complaint  Patient presents with   Weight Management Screening    Discussed the use of AI scribe software for clinical note transcription with the patient, who gave verbal consent to proceed.  History of Present Illness   Cynthia Rice is a 38 year old female who presents for follow-up on weight management with phentermine .  She has been taking a higher dose of phentermine  and reports that everything is going well. Despite increased food intake during the holidays, she has not experienced any issues with the medication. No side effects from the phentermine  are reported, and she has no complaints about it.  She is involved with FMLA paperwork due to mild depression and to care for her daughter, who has ADHD. She confirms that 'mental health' was included in the documentation, with the primary purpose being to care for her daughter.     Relevant past medical, surgical, family and social history reviewed and updated as indicated. Interim medical history since our last visit reviewed. Allergies and medications reviewed and updated.  ROS per HPI unless specifically indicated above     Objective:    LMP 10/06/2024 (Approximate)   Wt Readings from Last 3 Encounters:  10/18/24 225 lb (102.1 kg)  09/16/24 221 lb (100.2 kg)  06/13/24 229 lb 6.4 oz (104.1 kg)     Physical Exam Constitutional:      General: She is not in acute distress.    Appearance: Normal appearance.  Neurological:     General: No focal deficit present.      Mental Status: She is alert. Mental status is at baseline.      LIMITED EXAM GIVEN VIDEO VISIT     Assessment & Plan:  Assessment & Plan   BMI 35.0-35.9,adult Assessment & Plan: Management with phentermine  ongoing. Discussed Wegovy  and a new oral weight loss medication as options for self-pay. - Continue phentermine  30 mg. - Sent information about Wegovy  and the new oral weight loss medication. - Schedule follow-up with Darice in February for potential phentermine  dose adjustment.   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 Daughter's ADHD is primary reason for FMLA request and requires missed work.  - Completed FMLA paperwork for mental health reasons.      Follow up plan: Return in about 3 months (around 02/14/2025).  Cynthia SHAUNNA Nett, MD   This visit was completed via video visit through MyChart due to the restrictions of the COVID-19 pandemic. All issues as above were discussed and addressed. Physical exam was done as above through visual confirmation on video through MyChart. If it was felt that the patient should be evaluated in the office, they were directed there. The patient verbally consented to this visit.  Location of the patient: home Location of the provider: work Those involved with this call:  Provider: Hadassah Nett, MD CMA: Laymon Metro, CMA Time spent on call: 15 minutes with patient face to face via video conference. More than 50% of  this time was spent in counseling and coordination of care. 15 minutes total spent in review of patient's record and preparation of their chart. Total time spent on this encounter: 30 minutes.  "

## 2024-11-16 NOTE — Assessment & Plan Note (Addendum)
 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 Daughter's ADHD is primary reason for FMLA request and requires missed work.  - Completed FMLA paperwork for mental health reasons.

## 2024-11-21 ENCOUNTER — Other Ambulatory Visit: Payer: Self-pay

## 2024-11-21 NOTE — Patient Outreach (Signed)
 LCSW called patient at scheduled time. LCSW was unable to reach patient. LCSW left voicemail scheduling another appointment for 11/30/2024 at 2:00 PM.  Olam Ally, MSW, LCSW [place]  [facility], [facility] Licensed Clinical Social Worker Direct Dial: [phone]

## 2024-11-21 NOTE — Patient Instructions (Signed)
 Visit Information  Cynthia Rice - I am sorry I was unable to reach you today for our scheduled appointment. I work with Herold, Hadassah SQUIBB, MD and am calling to support your healthcare needs. Please contact me at [phone] at your earliest convenience. I look forward to speaking with you soon.    Your next care management appointment is by telephone on 11/30/2024 at 2:00 PM   Please call the care guide team at [phone] if you need to cancel, schedule, or reschedule an appointment.   Please call the Suicide and Crisis Lifeline: 988 if you are experiencing a Mental Health or Behavioral Health Crisis or need someone to talk to.  Olam Ally, MSW, LCSW [place]  [facility], [facility] Licensed Clinical Social Worker Direct Dial: [phone]

## 2024-11-30 ENCOUNTER — Other Ambulatory Visit: Payer: Self-pay

## 2024-11-30 NOTE — Patient Outreach (Signed)
 LCSW called patient and was unable to reach patient. LCSW left voicemail with next outreach of 1/192/2026 at 2:30pm. This is the 2nd unsuccessful attempt.  Olam Ally, MSW, LCSW [place]  [facility], [facility] Licensed Clinical Social Worker Direct Dial: [phone]

## 2024-11-30 NOTE — Patient Instructions (Signed)
 Cynthia Rice - I am sorry I was unable to reach you today for our scheduled appointment. I work with Herold, Hadassah SQUIBB, MD (Inactive) and am calling to support your healthcare needs. Please contact me at [phone] at your earliest convenience. I look forward to speaking with you soon.   Next appointment scheduled for over the phone on 12/05/2024 at 2:30 PM  Thank you,  Olam Ally, MSW, LCSW [place]  [facility], [facility] Licensed Clinical Social Worker Direct Dial: [phone]

## 2024-12-05 ENCOUNTER — Other Ambulatory Visit: Payer: Self-pay

## 2024-12-05 NOTE — Patient Instructions (Addendum)
"   Willeen Brain - I am sorry I was unable to reach you today for our scheduled appointment. I work with Herold, Hadassah SQUIBB, MD (Inactive) and am calling to support your healthcare needs. Please contact me at [phone] at your earliest convenience. I look forward to speaking with you soon.   Thank you,  Olam Ally, MSW, LCSW [place]  [facility], [facility] Licensed Clinical Social Worker Direct Dial: [phone]  "

## 2024-12-05 NOTE — Patient Outreach (Signed)
 LCSW made a 3rd unsuccessful attempt to reach patient. LCSW left voicemail notifying patient that services would be closed out and patient could reach out LCSW to schedule another appointment if needed.   Olam Ally, MSW, LCSW [place]  [facility], [facility] Licensed Clinical Social Worker Direct Dial: [phone]

## 2025-01-26 ENCOUNTER — Encounter: Admitting: Nurse Practitioner
